# Patient Record
Sex: Female | Born: 1980
Health system: Southern US, Community
[De-identification: ages and names within clinical notes are randomized; demographics above are authoritative.]

## PROBLEM LIST (undated history)

## (undated) DIAGNOSIS — G709 Myoneural disorder, unspecified: Secondary | ICD-10-CM

## (undated) DIAGNOSIS — O039 Complete or unspecified spontaneous abortion without complication: Secondary | ICD-10-CM

## (undated) DIAGNOSIS — Z8659 Personal history of other mental and behavioral disorders: Secondary | ICD-10-CM

## (undated) DIAGNOSIS — F329 Major depressive disorder, single episode, unspecified: Secondary | ICD-10-CM

## (undated) DIAGNOSIS — Z91419 Personal history of unspecified adult abuse: Secondary | ICD-10-CM

## (undated) DIAGNOSIS — E041 Nontoxic single thyroid nodule: Secondary | ICD-10-CM

## (undated) DIAGNOSIS — Z87440 Personal history of urinary (tract) infections: Secondary | ICD-10-CM

## (undated) DIAGNOSIS — O1410 Severe pre-eclampsia, unspecified trimester: Secondary | ICD-10-CM

## (undated) DIAGNOSIS — I8290 Acute embolism and thrombosis of unspecified vein: Secondary | ICD-10-CM

## (undated) DIAGNOSIS — F988 Other specified behavioral and emotional disorders with onset usually occurring in childhood and adolescence: Secondary | ICD-10-CM

## (undated) DIAGNOSIS — R87619 Unspecified abnormal cytological findings in specimens from cervix uteri: Secondary | ICD-10-CM

## (undated) DIAGNOSIS — F32A Depression, unspecified: Secondary | ICD-10-CM

## (undated) HISTORY — DX: Depression, unspecified: F32.A

## (undated) HISTORY — DX: Personal history of unspecified adult abuse: Z91.419

## (undated) HISTORY — DX: Nontoxic single thyroid nodule: E04.1

## (undated) HISTORY — PX: TONSILLECTOMY: SUR1361

## (undated) HISTORY — DX: Personal history of urinary (tract) infections: Z87.440

## (undated) HISTORY — DX: Personal history of other mental and behavioral disorders: Z86.59

## (undated) HISTORY — DX: Myoneural disorder, unspecified: G70.9

## (undated) HISTORY — DX: Other specified behavioral and emotional disorders with onset usually occurring in childhood and adolescence: F98.8

## (undated) HISTORY — DX: Severe pre-eclampsia, unspecified trimester: O14.10

## (undated) HISTORY — PX: TOE SURGERY: SHX1073

## (undated) HISTORY — DX: Acute embolism and thrombosis of unspecified vein: I82.90

---
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
## 2012-06-10 ENCOUNTER — Encounter (HOSPITAL_COMMUNITY): Payer: Self-pay | Admitting: Emergency Medicine

## 2012-06-10 ENCOUNTER — Emergency Department (HOSPITAL_COMMUNITY)
Admission: EM | Admit: 2012-06-10 | Discharge: 2012-06-11 | Disposition: A | Discharge status: Home / Self Care | Payer: Medicaid Other | Attending: Emergency Medicine | Admitting: Emergency Medicine

## 2012-06-10 DIAGNOSIS — M549 Dorsalgia, unspecified: Secondary | ICD-10-CM | POA: Insufficient documentation

## 2012-06-10 DIAGNOSIS — IMO0001 Reserved for inherently not codable concepts without codable children: Secondary | ICD-10-CM

## 2012-06-10 DIAGNOSIS — R5381 Other malaise: Secondary | ICD-10-CM | POA: Insufficient documentation

## 2012-06-10 DIAGNOSIS — R509 Fever, unspecified: Secondary | ICD-10-CM

## 2012-06-10 DIAGNOSIS — O039 Complete or unspecified spontaneous abortion without complication: Secondary | ICD-10-CM

## 2012-06-10 DIAGNOSIS — R110 Nausea: Secondary | ICD-10-CM | POA: Insufficient documentation

## 2012-06-10 DIAGNOSIS — N83209 Unspecified ovarian cyst, unspecified side: Secondary | ICD-10-CM | POA: Insufficient documentation

## 2012-06-10 DIAGNOSIS — R109 Unspecified abdominal pain: Secondary | ICD-10-CM

## 2012-06-10 HISTORY — DX: Reserved for inherently not codable concepts without codable children: IMO0001

## 2012-06-10 HISTORY — DX: Complete or unspecified spontaneous abortion without complication: O03.9

## 2012-06-10 LAB — URINALYSIS, ROUTINE W REFLEX MICROSCOPIC
Bilirubin Urine: NEGATIVE
Glucose, UA: NEGATIVE mg/dL
Hgb urine dipstick: NEGATIVE
Ketones, ur: NEGATIVE mg/dL
Leukocytes, UA: NEGATIVE
Nitrite: NEGATIVE
Protein, ur: NEGATIVE mg/dL
Specific Gravity, Urine: 1.007 (ref 1.005–1.030)
Urobilinogen, UA: 0.2 mg/dL (ref 0.0–1.0)
pH: 6 (ref 5.0–8.0)

## 2012-06-10 MED ORDER — SODIUM CHLORIDE 0.9 % IV BOLUS (SEPSIS)
1000.0000 mL | Freq: Once | INTRAVENOUS | Status: AC
  Administered 2012-06-11: 1000 mL via INTRAVENOUS

## 2012-06-10 NOTE — ED Notes (Signed)
Pt c/o fever, chills, back pain and abd pain. Fever 101 at home. Pt did have elective abortion Friday.

## 2012-06-11 ENCOUNTER — Encounter (HOSPITAL_COMMUNITY): Payer: Self-pay

## 2012-06-11 ENCOUNTER — Emergency Department (HOSPITAL_COMMUNITY): Payer: Medicaid Other

## 2012-06-11 LAB — CBC WITH DIFFERENTIAL/PLATELET
Basophils Absolute: 0 10*3/uL (ref 0.0–0.1)
Basophils Relative: 0 % (ref 0–1)
Eosinophils Absolute: 0.1 10*3/uL (ref 0.0–0.7)
Eosinophils Relative: 2 % (ref 0–5)
HCT: 34.2 % — ABNORMAL LOW (ref 36.0–46.0)
Hemoglobin: 12.2 g/dL (ref 12.0–15.0)
Lymphocytes Relative: 15 % (ref 12–46)
Lymphs Abs: 1.1 10*3/uL (ref 0.7–4.0)
MCH: 30.1 pg (ref 26.0–34.0)
MCHC: 35.7 g/dL (ref 30.0–36.0)
MCV: 84.4 fL (ref 78.0–100.0)
Monocytes Absolute: 0.9 10*3/uL (ref 0.1–1.0)
Monocytes Relative: 12 % (ref 3–12)
Neutro Abs: 5.3 10*3/uL (ref 1.7–7.7)
Neutrophils Relative %: 71 % (ref 43–77)
Platelets: 177 10*3/uL (ref 150–400)
RBC: 4.05 MIL/uL (ref 3.87–5.11)
RDW: 12 % (ref 11.5–15.5)
WBC: 7.4 10*3/uL (ref 4.0–10.5)

## 2012-06-11 LAB — BASIC METABOLIC PANEL
BUN: 11 mg/dL (ref 6–23)
CO2: 24 mEq/L (ref 19–32)
Calcium: 9 mg/dL (ref 8.4–10.5)
Chloride: 100 mEq/L (ref 96–112)
Creatinine, Ser: 0.56 mg/dL (ref 0.50–1.10)
GFR calc Af Amer: >90 mL/min (ref >90)
GFR calc non Af Amer: >90 mL/min (ref >90)
Glucose, Bld: 90 mg/dL (ref 70–99)
Potassium: 3.4 mEq/L — ABNORMAL LOW (ref 3.5–5.1)
Sodium: 135 mEq/L (ref 135–145)

## 2012-06-11 LAB — WET PREP, GENITAL
Trich, Wet Prep: NONE SEEN
Yeast Wet Prep HPF POC: NONE SEEN

## 2012-06-11 MED ORDER — ONDANSETRON HCL 4 MG/2ML IJ SOLN
4.0000 mg | Freq: Once | INTRAMUSCULAR | Status: AC
  Administered 2012-06-11: 4 mg via INTRAVENOUS
  Filled 2012-06-11: qty 2

## 2012-06-11 MED ORDER — POTASSIUM CHLORIDE CRYS ER 20 MEQ PO TBCR
40.0000 meq | EXTENDED_RELEASE_TABLET | Freq: Once | ORAL | Status: AC
  Administered 2012-06-11: 40 meq via ORAL
  Filled 2012-06-11: qty 2

## 2012-06-11 MED ORDER — IOHEXOL 300 MG/ML  SOLN
50.0000 mL | Freq: Once | INTRAMUSCULAR | Status: AC | PRN
  Administered 2012-06-11: 50 mL via ORAL

## 2012-06-11 MED ORDER — IBUPROFEN 200 MG PO TABS
400.0000 mg | ORAL_TABLET | Freq: Once | ORAL | Status: AC
Start: 2012-06-11 — End: 2012-06-11
  Administered 2012-06-11: 400 mg via ORAL
  Filled 2012-06-11: qty 2

## 2012-06-11 MED ORDER — MORPHINE SULFATE 2 MG/ML IJ SOLN
2.0000 mg | Freq: Once | INTRAMUSCULAR | Status: AC
  Administered 2012-06-11: 2 mg via INTRAVENOUS
  Filled 2012-06-11: qty 1

## 2012-06-11 MED ORDER — IOHEXOL 300 MG/ML  SOLN
100.0000 mL | Freq: Once | INTRAMUSCULAR | Status: AC | PRN
  Administered 2012-06-11: 100 mL via INTRAVENOUS

## 2012-06-11 NOTE — ED Provider Notes (Signed)
History     CSN: [id number]  Arrival date & time 06/10/12  2249   First MD Initiated Contact with Patient 06/10/12 2313      Chief Complaint  Patient presents with  . Post-op Problem   HPI  History provided by the patient and mother. Patient is a 32 year old female with no significant PMH and history of recent elective abortion with D&C procedure performed yesterday who presents with complaints of fever and lower abdomen and back pain. Patient reports having only slight discomfort following the procedure with expected vaginal bleeding. Vaginal bleeding did improve however today she began feeling increased fatigue and fever symptoms with chills. Fever began to increase above 101 at home and patient called the OB/GYN clinic in [facility] where she had procedure performed and was advised to come to emergency room. She took 400 mg of ibuprofen at 9:30 prior to arrival and reports this helped with fever and chills symptoms. She continues to complain of mild discomfort primarily in the back. Denies any nausea, vomiting symptoms. No dysuria, hematuria or urinary frequency. Denies any diarrhea or constipation.    Past Medical History  Diagnosis Date  . Abortion 06/10/2012    History reviewed. No pertinent past surgical history.  No family history on file.  History  Substance Use Topics  . Smoking status: Never Smoker   . Smokeless tobacco: Not on file  . Alcohol Use: No    OB History   Grav Para Term Preterm Abortions TAB SAB Ect Mult Living                  Review of Systems  Constitutional: Positive for fever and chills.  Respiratory: Negative for cough and shortness of breath.   Gastrointestinal: Positive for nausea and abdominal pain. Negative for vomiting, diarrhea and constipation.  Genitourinary: Positive for vaginal bleeding. Negative for dysuria, frequency, hematuria, flank pain, vaginal discharge and pelvic pain.  Musculoskeletal: Positive for back pain.  All other  systems reviewed and are negative.    Allergies  Review of patient's allergies indicates no known allergies.  Home Medications   Current Outpatient Rx  Name  Route  Sig  Dispense  Refill  . amoxicillin (AMOXIL) 500 MG tablet   Oral   Take 500 mg by mouth 2 (two) times daily.         Marland Kitchen ibuprofen (ADVIL,MOTRIN) 400 MG tablet   Oral   Take 400 mg by mouth every 6 (six) hours as needed for fever.           BP 124/68  Pulse 92  Temp(Src) 99.3 F (37.4 C) (Oral)  Resp 18  Ht 5\' 4"  (1.626 m)  Wt 143 lb 4.8 oz (65 kg)  BMI 24.59 kg/m2  SpO2 100%  LMP 05/01/2012  Physical Exam  Nursing note and vitals reviewed. Constitutional: She is oriented to person, place, and time. She appears well-developed and well-nourished. No distress.  HENT:  Head: Normocephalic.  Cardiovascular: Normal rate and regular rhythm.   Pulmonary/Chest: Effort normal and breath sounds normal. No respiratory distress. She has no wheezes.  Abdominal: Soft. There is tenderness in the right lower quadrant. There is tenderness at McBurney's point. There is no rebound, no guarding and no CVA tenderness.  Genitourinary:  Chaperone was present. Small amounts of blood streaks in discharge cervix. Cervix otherwise appears normal. There is no significant adnexal or uterine tenderness.   Musculoskeletal: Normal range of motion.  Neurological: She is alert and oriented to person, place,  and time.  Skin: Skin is warm and dry. No rash noted.  Psychiatric: She has a normal mood and affect. Her behavior is normal.    ED Course  Procedures   Results for orders placed during the hospital encounter of 06/10/12  WET PREP, GENITAL      Result Value Range   Yeast Wet Prep HPF POC NONE SEEN  NONE SEEN   Trich, Wet Prep NONE SEEN  NONE SEEN   Clue Cells Wet Prep HPF POC RARE (*) NONE SEEN   WBC, Wet Prep HPF POC FEW (*) NONE SEEN  CBC WITH DIFFERENTIAL      Result Value Range   WBC 7.4  4.0 - 10.5 K/uL   RBC 4.05   3.87 - 5.11 MIL/uL   Hemoglobin 12.2  12.0 - 15.0 g/dL   HCT 40.9 (*) 81.1 - 91.4 %   MCV 84.4  78.0 - 100.0 fL   MCH 30.1  26.0 - 34.0 pg   MCHC 35.7  30.0 - 36.0 g/dL   RDW 78.2  95.6 - 21.3 %   Platelets 177  150 - 400 K/uL   Neutrophils Relative 71  43 - 77 %   Neutro Abs 5.3  1.7 - 7.7 K/uL   Lymphocytes Relative 15  12 - 46 %   Lymphs Abs 1.1  0.7 - 4.0 K/uL   Monocytes Relative 12  3 - 12 %   Monocytes Absolute 0.9  0.1 - 1.0 K/uL   Eosinophils Relative 2  0 - 5 %   Eosinophils Absolute 0.1  0.0 - 0.7 K/uL   Basophils Relative 0  0 - 1 %   Basophils Absolute 0.0  0.0 - 0.1 K/uL  URINALYSIS, ROUTINE W REFLEX MICROSCOPIC      Result Value Range   Color, Urine YELLOW  YELLOW   APPearance CLOUDY (*) CLEAR   Specific Gravity, Urine 1.007  1.005 - 1.030   pH 6.0  5.0 - 8.0   Glucose, UA NEGATIVE  NEGATIVE mg/dL   Hgb urine dipstick NEGATIVE  NEGATIVE   Bilirubin Urine NEGATIVE  NEGATIVE   Ketones, ur NEGATIVE  NEGATIVE mg/dL   Protein, ur NEGATIVE  NEGATIVE mg/dL   Urobilinogen, UA 0.2  0.0 - 1.0 mg/dL   Nitrite NEGATIVE  NEGATIVE   Leukocytes, UA NEGATIVE  NEGATIVE  BASIC METABOLIC PANEL      Result Value Range   Sodium 135  135 - 145 mEq/L   Potassium 3.4 (*) 3.5 - 5.1 mEq/L   Chloride 100  96 - 112 mEq/L   CO2 24  19 - 32 mEq/L   Glucose, Bld 90  70 - 99 mg/dL   BUN 11  6 - 23 mg/dL   Creatinine, Ser 0.86  0.50 - 1.10 mg/dL   Calcium 9.0  8.4 - 57.8 mg/dL   GFR calc non Af Amer >90  >90 mL/min   GFR calc Af Amer >90  >90 mL/min        US Transvaginal Non-ob  06/11/2012  *RADIOLOGY REPORT*  Clinical Data: Pelvic pain post abortion 06/08/2012.  Fever, back pain, cramping.  TRANSABDOMINAL AND TRANSVAGINAL ULTRASOUND OF PELVIS Technique:  Both transabdominal and transvaginal ultrasound examinations of the pelvis were performed. Transabdominal technique was performed for global imaging of the pelvis including uterus, ovaries, adnexal regions, and pelvic cul-de-sac.   It was necessary to proceed with endovaginal exam following the transabdominal exam to visualize the pelvic organs as the pelvis was obscured by  bowel gas on transabdominal imaging.  Comparison:  None  Findings:  Uterus: The uterus is retroverted and measures about 8.9 x 5.6 x 6.6 cm.  No myometrial masses.  Small cysts in the cervical region consistent with Nabothian cysts.  Endometrium: Thickened endometrium with heterogeneous mixed echotexture material.  Endometrial stripe thickness measures up to about 17 mm.  Color flow Doppler imaging demonstrates a myometrial hyperemia with minimal flow in the endometrial collections. Changes may represent blood clots or retained products of conception.  Right ovary:  Right ovary measures 3.7 x 2 x 1.9 cm.  Normal follicular changes are demonstrated.  There is a dominant complex appearing cystic structure measuring about 1.9 cm diameter probably representing a corpus luteum.  Flow is demonstrated in the right ovary on color flow Doppler imaging.  Left ovary: The left ovary measures 2.7 x 1 x 1.6 cm.  Normal follicular changes are demonstrated.  Flow is demonstrated in the left ovary on color flow Doppler imaging.  Other findings: Small amount of free fluid demonstrated throughout the pelvis.  Internal echoes are present suggesting complex fluid. Changes could represent infection or hemorrhage.  IMPRESSION: Expansion and endometrium with mixed echotexture material containing minimal blood flow.  Changes could represent blood clots or retained products of conception.  Resolving corpus luteum of the right ovary.  Small amount of complex fluid in the pelvis could represent hemorrhage or infection.   Original Report Authenticated By: Burman Nieves, M.D.    US Pelvis Complete  06/11/2012  *RADIOLOGY REPORT*  Clinical Data: Pelvic pain post abortion 06/08/2012.  Fever, back pain, cramping.  TRANSABDOMINAL AND TRANSVAGINAL ULTRASOUND OF PELVIS Technique:  Both transabdominal and  transvaginal ultrasound examinations of the pelvis were performed. Transabdominal technique was performed for global imaging of the pelvis including uterus, ovaries, adnexal regions, and pelvic cul-de-sac.  It was necessary to proceed with endovaginal exam following the transabdominal exam to visualize the pelvic organs as the pelvis was obscured by bowel gas on transabdominal imaging.  Comparison:  None  Findings:  Uterus: The uterus is retroverted and measures about 8.9 x 5.6 x 6.6 cm.  No myometrial masses.  Small cysts in the cervical region consistent with Nabothian cysts.  Endometrium: Thickened endometrium with heterogeneous mixed echotexture material.  Endometrial stripe thickness measures up to about 17 mm.  Color flow Doppler imaging demonstrates a myometrial hyperemia with minimal flow in the endometrial collections. Changes may represent blood clots or retained products of conception.  Right ovary:  Right ovary measures 3.7 x 2 x 1.9 cm.  Normal follicular changes are demonstrated.  There is a dominant complex appearing cystic structure measuring about 1.9 cm diameter probably representing a corpus luteum.  Flow is demonstrated in the right ovary on color flow Doppler imaging.  Left ovary: The left ovary measures 2.7 x 1 x 1.6 cm.  Normal follicular changes are demonstrated.  Flow is demonstrated in the left ovary on color flow Doppler imaging.  Other findings: Small amount of free fluid demonstrated throughout the pelvis.  Internal echoes are present suggesting complex fluid. Changes could represent infection or hemorrhage.  IMPRESSION: Expansion and endometrium with mixed echotexture material containing minimal blood flow.  Changes could represent blood clots or retained products of conception.  Resolving corpus luteum of the right ovary.  Small amount of complex fluid in the pelvis could represent hemorrhage or infection.   Original Report Authenticated By: Burman Nieves, M.D.    Ct Abdomen Pelvis  W Contrast  06/11/2012  *RADIOLOGY  REPORT*  Clinical Data: Fever, chills, back pain, and abdominal pain. History of abortion on 06/09/2012.  CT ABDOMEN AND PELVIS WITH CONTRAST  Technique:  Multidetector CT imaging of the abdomen and pelvis was performed following the standard protocol during bolus administration of intravenous contrast.  Contrast: OMNIPAQUE IOHEXOL 300 MG/ML  SOLN  Comparison: None.  Findings: The lung bases are clear.  The liver, spleen, gallbladder, pancreas, adrenal glands, kidneys, abdominal aorta, and retroperitoneal lymph nodes are unremarkable. Small accessory spleen.  The stomach, small bowel, and colon are decompressed.  Contrast material flows to the colon suggesting no evidence of obstruction.  No free air or free fluid in the abdomen.  Pelvis:  Mild prominence of the uterus and endometrium.  Uterus is retroverted.  See additional report of pelvic ultrasound from today's date.  Minimal free fluid demonstrated in the pelvis.  No loculated fluid collection to suggest abscess.  Mild thickening of the bladder wall may represent hypertrophy or cystitis.  No significant pelvic lymphadenopathy.  The appendix is normal.  No diverticulitis.  Normal alignment of the lumbar vertebrae.  IMPRESSION: Mild prominence of the uterus with endometrial thickening and small amount of free pelvic fluid.  See additional report of pelvic ultrasound of the same date.  Examination is otherwise unremarkable.   Original Report Authenticated By: Burman Nieves, M.D.      1. Fever   2. Abdominal pain   3. Ovarian cyst       MDM  Patient seen and evaluated. Patient in no acute distress with no signs of significant discomfort at this time. Patient does not appear severely ill or toxic.  Pt had elective D&C for abortion at [facility] in [facility] on Friday by Dr. Okey Dupre. [phone]  Pt continues to do well.  Labs unremarkable so far. Imaging still pending.    [place] and CT without any  overwhelmingly concerning findings.  There may be evidence of retained products of conception vs blood clots.    Spoke with Dr. Wallie Renshaw on call for Dr. Okey Dupre at [facility]. I discussed lab tests results and imaging findings. He feels patient may followup outpatient in the office later today.   Discussed plan with patient and instructed her to call office today for close followup she agrees with plan.   Angus Seller, PA-C 06/11/12 [phone]

## 2012-06-11 NOTE — ED Provider Notes (Signed)
Medical screening examination/treatment/procedure(s) were performed by non-physician practitioner and as supervising physician I was immediately available for consultation/collaboration.  Ethelda Chick, MD 06/11/12 [phone]

## 2012-06-12 LAB — GC/CHLAMYDIA PROBE AMP
CT Probe RNA: NEGATIVE
GC Probe RNA: NEGATIVE

## 2012-09-20 ENCOUNTER — Inpatient Hospital Stay (HOSPITAL_COMMUNITY)
Admission: AD | Admit: 2012-09-20 | Discharge: 2012-09-20 | Discharge status: Against Medical Advice | Payer: Medicaid Other | Source: Ambulatory Visit | Attending: Obstetrics & Gynecology | Admitting: Obstetrics & Gynecology

## 2012-09-20 NOTE — MAU Note (Signed)
Not in lobby

## 2013-02-04 ENCOUNTER — Emergency Department (HOSPITAL_COMMUNITY): Payer: No Typology Code available for payment source

## 2013-02-04 ENCOUNTER — Encounter (HOSPITAL_COMMUNITY): Payer: Self-pay | Admitting: Emergency Medicine

## 2013-02-04 ENCOUNTER — Emergency Department (HOSPITAL_COMMUNITY)
Admission: EM | Admit: 2013-02-04 | Discharge: 2013-02-04 | Disposition: A | Discharge status: Home / Self Care | Payer: No Typology Code available for payment source | Attending: Emergency Medicine | Admitting: Emergency Medicine

## 2013-02-04 DIAGNOSIS — M542 Cervicalgia: Secondary | ICD-10-CM | POA: Diagnosis not present

## 2013-02-04 DIAGNOSIS — S59909A Unspecified injury of unspecified elbow, initial encounter: Secondary | ICD-10-CM | POA: Diagnosis present

## 2013-02-04 DIAGNOSIS — S63509A Unspecified sprain of unspecified wrist, initial encounter: Secondary | ICD-10-CM | POA: Diagnosis not present

## 2013-02-04 DIAGNOSIS — Y939 Activity, unspecified: Secondary | ICD-10-CM | POA: Diagnosis not present

## 2013-02-04 DIAGNOSIS — Y92410 Unspecified street and highway as the place of occurrence of the external cause: Secondary | ICD-10-CM | POA: Insufficient documentation

## 2013-02-04 MED ORDER — ONDANSETRON 4 MG PO TBDP
4.0000 mg | ORAL_TABLET | Freq: Once | ORAL | Status: AC
  Administered 2013-02-04: 4 mg via ORAL
  Filled 2013-02-04: qty 1

## 2013-02-04 MED ORDER — IBUPROFEN 400 MG PO TABS
800.0000 mg | ORAL_TABLET | Freq: Once | ORAL | Status: AC
  Administered 2013-02-04: 800 mg via ORAL
  Filled 2013-02-04: qty 2
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
## 2013-02-07 NOTE — ED Provider Notes (Signed)
Medical screening examination/treatment/procedure(s) were performed by non-physician practitioner and as supervising physician I was immediately available for consultation/collaboration.  EKG Interpretation   None        Juliet Rude. Rubin Payor, MD 02/07/13 [phone]

## 2013-06-14 ENCOUNTER — Ambulatory Visit: Payer: Medicaid Other | Admitting: Endocrinology

## 2013-06-20 ENCOUNTER — Encounter: Payer: Self-pay | Admitting: Endocrinology

## 2013-06-20 ENCOUNTER — Ambulatory Visit (INDEPENDENT_AMBULATORY_CARE_PROVIDER_SITE_OTHER): Payer: BC Managed Care – PPO | Admitting: Endocrinology

## 2013-06-20 VITALS — BP 122/64 | HR 78 | Temp 97.7°F | Resp 16 | Ht 64.0 in | Wt 143.2 lb

## 2013-06-20 DIAGNOSIS — E041 Nontoxic single thyroid nodule: Secondary | ICD-10-CM
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
## 2013-06-21 LAB — TSH: TSH: 0.31 u[IU]/mL — ABNORMAL LOW (ref 0.35–5.50)

## 2013-06-21 LAB — T4, FREE: Free T4: 0.82 ng/dL (ref 0.60–1.60)

## 2013-06-21 NOTE — Addendum Note (Signed)
Addended by: Elayne Snare on: 06/21/2013 12:52 PM   Modules accepted: Orders

## 2013-06-24 ENCOUNTER — Telehealth: Payer: Self-pay | Admitting: Endocrinology

## 2013-06-24 ENCOUNTER — Ambulatory Visit
Admission: RE | Admit: 2013-06-24 | Discharge: 2013-06-24 | Disposition: A | Discharge status: Home / Self Care | Payer: BC Managed Care – PPO | Source: Ambulatory Visit | Attending: Endocrinology | Admitting: Endocrinology

## 2013-06-24 DIAGNOSIS — E041 Nontoxic single thyroid nodule: Secondary | ICD-10-CM

## 2013-06-24 NOTE — Telephone Encounter (Signed)
Pt would like some more details on the testing you are sending her for.

## 2013-07-01 ENCOUNTER — Ambulatory Visit (HOSPITAL_COMMUNITY): Payer: BC Managed Care – PPO

## 2013-07-02 ENCOUNTER — Encounter: Payer: Self-pay | Admitting: Sports Medicine

## 2013-07-02 ENCOUNTER — Other Ambulatory Visit: Payer: Self-pay | Admitting: *Deleted

## 2013-07-02 ENCOUNTER — Ambulatory Visit (INDEPENDENT_AMBULATORY_CARE_PROVIDER_SITE_OTHER): Payer: BC Managed Care – PPO | Admitting: Sports Medicine

## 2013-07-02 VITALS — BP 148/97 | HR 89 | Ht 64.0 in | Wt 143.0 lb

## 2013-07-02 DIAGNOSIS — G894 Chronic pain syndrome: Secondary | ICD-10-CM | POA: Insufficient documentation

## 2013-07-02 DIAGNOSIS — IMO0002 Reserved for concepts with insufficient information to code with codable children: Secondary | ICD-10-CM

## 2013-07-02 DIAGNOSIS — S139XXA Sprain of joints and ligaments of unspecified parts of neck, initial encounter: Secondary | ICD-10-CM

## 2013-07-02 MED ORDER — AMITRIPTYLINE HCL 25 MG PO TABS: 25.0000 mg | ORAL_TABLET | Freq: Every day | ORAL | Status: DC

## 2013-07-02 MED ORDER — TRAMADOL HCL 50 MG PO TABS: 50.0000 mg | ORAL_TABLET | Freq: Four times a day (QID) | ORAL | Status: DC | PRN
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
## 2013-07-02 NOTE — Patient Instructions (Addendum)
Stop taking cyclobenzaprine- start taking amitriptyline 25 mg and tramadol 50 mg at bedtime.  Take diclofenac during the day as needed  Do easy range of motion with neck and shake outs  Try thera wrap   Vitamin B6 - 100 mg daily   Ok to try Dr. Angela Adam for PT   Please follow up in 4 weeks  Thank you for seeing [place] today!

## 2013-07-02 NOTE — Assessment & Plan Note (Signed)
We will start her treatment with more consistent medication usage.  College amitriptyline at a higher dose than the Flexeril. Regular tramadol at nighttime. Diclofenac twice daily during working hours.  Use heat over trapezius muscle bilaterally.  She will see Erica Atkins for PT and see if he can progress her to a greater function.  She may want to try a soft cervical collar for some relative rest of her neck.  Recheck by me in one month after trying these changes.

## 2013-07-05 ENCOUNTER — Telehealth: Payer: Self-pay | Admitting: *Deleted

## 2013-07-05 NOTE — Telephone Encounter (Signed)
Patient was calling you back about her results. CB# [id number]

## 2013-07-05 NOTE — Telephone Encounter (Signed)
Discuss previous results including thyroid functions. She is going to do a nuclear scan and we will discuss possible biopsy subsequently

## 2013-07-08 ENCOUNTER — Encounter: Payer: Self-pay | Admitting: *Deleted

## 2013-07-08 NOTE — Progress Notes (Signed)
Patient ID: Erica Atkins, female   DOB: 05-Jan-1981, 33 y.o.   MRN: [id number] Per dr fields, told pt on Friday: 1-called/will increase tramadol 2-watch for signs of infection from dry needling 3-go to [facility] if any fever or infection

## 2013-07-11 ENCOUNTER — Encounter (HOSPITAL_COMMUNITY)
Admission: RE | Admit: 2013-07-11 | Discharge: 2013-07-11 | Disposition: A | Discharge status: Home / Self Care | Payer: BC Managed Care – PPO | Source: Ambulatory Visit | Attending: Endocrinology | Admitting: Endocrinology

## 2013-07-11 DIAGNOSIS — E079 Disorder of thyroid, unspecified: Secondary | ICD-10-CM | POA: Insufficient documentation

## 2013-07-11 DIAGNOSIS — E041 Nontoxic single thyroid nodule: Secondary | ICD-10-CM

## 2013-07-11 MED ORDER — SODIUM PERTECHNETATE TC 99M INJECTION
10.0000 | Freq: Once | INTRAVENOUS | Status: AC | PRN
  Administered 2013-07-11: 10 via INTRAVENOUS

## 2013-07-30 ENCOUNTER — Ambulatory Visit: Payer: BC Managed Care – PPO | Admitting: Sports Medicine

## 2013-09-12 ENCOUNTER — Encounter: Payer: Self-pay | Admitting: Sports Medicine

## 2013-09-12 ENCOUNTER — Ambulatory Visit (INDEPENDENT_AMBULATORY_CARE_PROVIDER_SITE_OTHER): Payer: BC Managed Care – PPO | Admitting: Sports Medicine

## 2013-09-12 VITALS — Ht 64.0 in | Wt 143.0 lb

## 2013-09-12 DIAGNOSIS — IMO0002 Reserved for concepts with insufficient information to code with codable children: Secondary | ICD-10-CM

## 2013-09-12 DIAGNOSIS — S139XXA Sprain of joints and ligaments of unspecified parts of neck, initial encounter: Secondary | ICD-10-CM

## 2013-09-12 DIAGNOSIS — G9050 Complex regional pain syndrome I, unspecified: Secondary | ICD-10-CM

## 2013-09-12 MED ORDER — DICLOFENAC SODIUM 75 MG PO TBEC: 75.0000 mg | DELAYED_RELEASE_TABLET | Freq: Two times a day (BID) | ORAL | Status: DC

## 2013-09-12 MED ORDER — AMITRIPTYLINE HCL 10 MG PO TABS: 10.0000 mg | ORAL_TABLET | Freq: Every day | ORAL | Status: DC
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
## 2013-09-12 NOTE — Patient Instructions (Addendum)
Balance exercises:   - Reach to touch object on floor. Repeat with eye closed  - Turn in circles with eyes closed, then jump up and down   Physical therapy:  Twice weekly for two weeks Weekly for four weeks Then transition to home program   Decrease amitriptyline to 10 mg nightly

## 2013-09-12 NOTE — Assessment & Plan Note (Signed)
I think she shows some improvement overall in her neck symptoms  I don't think her present symptoms are likely to any spinal stenosis  I suspect there is more the regional pain syndrome and hopefully this will respond to long term use of low-dose amitriptyline in physical therapy

## 2013-10-29 ENCOUNTER — Encounter: Payer: Self-pay | Admitting: Sports Medicine

## 2013-10-29 ENCOUNTER — Ambulatory Visit (INDEPENDENT_AMBULATORY_CARE_PROVIDER_SITE_OTHER): Payer: BC Managed Care – PPO | Admitting: Sports Medicine

## 2013-10-29 VITALS — BP 129/87 | HR 73 | Ht 64.0 in | Wt 143.3 lb

## 2013-10-29 DIAGNOSIS — S139XXA Sprain of joints and ligaments of unspecified parts of neck, initial encounter: Secondary | ICD-10-CM | POA: Diagnosis not present

## 2013-10-29 DIAGNOSIS — IMO0002 Reserved for concepts with insufficient information to code with codable children: Secondary | ICD-10-CM
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
## 2013-10-29 NOTE — Assessment & Plan Note (Signed)
She appears to found a treatment with some manipulation and chiropractic work that is helping I advised her to continue this  Continue on a home exercise program that emphasizes balance, posture and relaxation  I think it first the stress of this long-term injury may hard for her to relax and sleep normally I did advise certain relaxation techniques so she does not get neck spasm as often  She will continue with this plan and then see me in 2-3 months

## 2013-10-29 NOTE — Patient Instructions (Signed)
Glad you're doing better!!!  Continue seeing chiropractor, if this is helping  Exercises to strengthen right shoulder muscles, improve scapular (shoulder blade) support.  Rowing, external rotation, wall push-ups, etc.  Biofeedback, relaxation techniques, as discussed, particularly when you notice that you are getting tense.    Consider Vitamin B6 50 mg daily, which may help with nerve irritation/injury.

## 2013-12-03 ENCOUNTER — Ambulatory Visit (INDEPENDENT_AMBULATORY_CARE_PROVIDER_SITE_OTHER): Payer: BC Managed Care – PPO | Admitting: Sports Medicine

## 2013-12-03 ENCOUNTER — Encounter: Payer: Self-pay | Admitting: Sports Medicine

## 2013-12-03 VITALS — BP 143/83 | HR 78

## 2013-12-03 DIAGNOSIS — S139XXA Sprain of joints and ligaments of unspecified parts of neck, initial encounter: Secondary | ICD-10-CM | POA: Diagnosis not present

## 2013-12-03 DIAGNOSIS — IMO0002 Reserved for concepts with insufficient information to code with codable children: Secondary | ICD-10-CM
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
## 2013-12-03 NOTE — Assessment & Plan Note (Addendum)
-  With persistent intermittent trapezius and neck muscle spasm. -Formal order for physical therapy was placed today, as the patient has been out of PT for 2-3 months. -She will continue going to chiropractic treatments as well. -I think stress is also planning overall, with her also being in school and working -Given that she has been dealing with this chronic pain, and her condition certainly can be exacerbated by stress. It may not be unreasonable if her condition persists to discuss management of the psychosocial stress and coping mechanisms through a referral to behavioral health to teach her strategies for coping with stress and chronic pain. -She was provided a note for school detailing her condition with recommendations for allowing additional time with duties is she is experiencing an exacerbation of symptoms which prohibits her from her work. -She'll followup in 4 weeks or sooner for reassessment with Dr. Oneida Alar.

## 2013-12-10 ENCOUNTER — Encounter: Payer: Self-pay | Admitting: *Deleted

## 2013-12-10 NOTE — Patient Instructions (Signed)
Per Dr. Linnell Fulling, the pt can see chiropractor up to 3 times a week and receive PT 2-3 times a week.

## 2013-12-24 ENCOUNTER — Other Ambulatory Visit: Payer: Self-pay | Admitting: *Deleted

## 2013-12-24 ENCOUNTER — Telehealth: Payer: Self-pay | Admitting: *Deleted

## 2013-12-24 DIAGNOSIS — M25511 Pain in right shoulder: Secondary | ICD-10-CM

## 2013-12-24 NOTE — Telephone Encounter (Signed)
Sent PT referral to [facility] in [facility]

## 2014-01-07 ENCOUNTER — Ambulatory Visit (INDEPENDENT_AMBULATORY_CARE_PROVIDER_SITE_OTHER): Payer: BC Managed Care – PPO | Admitting: Sports Medicine

## 2014-01-07 ENCOUNTER — Encounter: Payer: Self-pay | Admitting: Sports Medicine

## 2014-01-07 VITALS — BP 134/87 | Ht 64.57 in | Wt 140.0 lb

## 2014-01-07 DIAGNOSIS — M62838 Other muscle spasm: Secondary | ICD-10-CM | POA: Insufficient documentation

## 2014-01-07 DIAGNOSIS — M6248 Contracture of muscle, other site: Secondary | ICD-10-CM | POA: Diagnosis not present

## 2014-01-07 MED ORDER — NORTRIPTYLINE HCL 10 MG PO CAPS: 10.0000 mg | ORAL_CAPSULE | Freq: Every day | ORAL | Status: DC
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
## 2014-01-08 ENCOUNTER — Other Ambulatory Visit (INDEPENDENT_AMBULATORY_CARE_PROVIDER_SITE_OTHER): Payer: BC Managed Care – PPO

## 2014-01-08 ENCOUNTER — Other Ambulatory Visit: Payer: Self-pay | Admitting: *Deleted

## 2014-01-08 DIAGNOSIS — E041 Nontoxic single thyroid nodule: Secondary | ICD-10-CM

## 2014-01-08 LAB — T4, FREE: Free T4: 0.92 ng/dL (ref 0.60–1.60)

## 2014-01-08 LAB — TSH: TSH: 0.61 u[IU]/mL (ref 0.35–4.50)

## 2014-01-13 ENCOUNTER — Ambulatory Visit: Payer: BC Managed Care – PPO

## 2014-01-14 ENCOUNTER — Other Ambulatory Visit: Payer: Self-pay | Admitting: *Deleted

## 2014-01-14 DIAGNOSIS — M25519 Pain in unspecified shoulder: Secondary | ICD-10-CM

## 2014-01-23 ENCOUNTER — Ambulatory Visit: Payer: BC Managed Care – PPO | Admitting: Endocrinology

## 2014-01-28 ENCOUNTER — Ambulatory Visit: Payer: BC Managed Care – PPO | Admitting: Sports Medicine

## 2014-02-04 ENCOUNTER — Other Ambulatory Visit: Payer: Self-pay | Admitting: *Deleted

## 2014-02-04 MED ORDER — GABAPENTIN 300 MG PO CAPS: 300.0000 mg | ORAL_CAPSULE | Freq: Every day | ORAL | Status: DC

## 2014-02-10 ENCOUNTER — Telehealth: Payer: Self-pay | Admitting: *Deleted

## 2014-02-10 NOTE — Telephone Encounter (Signed)
Called to make the appt to Dr Harvel Ricks but their office requires notes first. Will fax all her notes over to [phone], attn angie, and they will call pt with appt.

## 2014-02-11 ENCOUNTER — Ambulatory Visit (INDEPENDENT_AMBULATORY_CARE_PROVIDER_SITE_OTHER): Payer: BC Managed Care – PPO | Admitting: Sports Medicine

## 2014-02-11 ENCOUNTER — Encounter: Payer: Self-pay | Admitting: Sports Medicine

## 2014-02-11 VITALS — BP 138/91 | HR 92 | Ht 64.0 in | Wt 140.0 lb

## 2014-02-11 DIAGNOSIS — M62838 Other muscle spasm: Secondary | ICD-10-CM

## 2014-02-11 DIAGNOSIS — M5412 Radiculopathy, cervical region: Secondary | ICD-10-CM | POA: Diagnosis not present

## 2014-02-11 DIAGNOSIS — M6248 Contracture of muscle, other site: Secondary | ICD-10-CM | POA: Diagnosis not present

## 2014-02-11 NOTE — Patient Instructions (Signed)
Increase your gabapentin to 2  At night  If no excessive drowsiness after 1 week 1 in the morning and 2 anight  If no excessive drowsiness after week 2  1 morning / 1 at noon and 2 at night  We will prepare a letter for your neurosurgery visit and one detailing your visits here.
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
## 2014-02-11 NOTE — Assessment & Plan Note (Signed)
I am not sure if the ongoing symptoms are from the cervical spine changes noted on MRI or if they represent a Myofascial pain syndrome that has arisen form the chronic pain  I plan to push dose of gabapentin higher as long as she tolerates this  Get a consult from neuro surgery to see if we are missing more significant cervical spine pathology  Continue PT if helping

## 2014-02-11 NOTE — Assessment & Plan Note (Signed)
Spasm and winging today  Increase dose of gabapentin  Use tramadol as needed for pain relief

## 2014-02-27 ENCOUNTER — Encounter: Payer: Self-pay | Admitting: Endocrinology

## 2014-02-27 ENCOUNTER — Ambulatory Visit (INDEPENDENT_AMBULATORY_CARE_PROVIDER_SITE_OTHER): Payer: BC Managed Care – PPO | Admitting: Endocrinology

## 2014-02-27 ENCOUNTER — Other Ambulatory Visit: Payer: Self-pay | Admitting: *Deleted

## 2014-02-27 VITALS — BP 122/74 | HR 73 | Temp 97.7°F | Resp 14 | Ht 64.0 in | Wt 148.0 lb

## 2014-02-27 DIAGNOSIS — E041 Nontoxic single thyroid nodule: Secondary | ICD-10-CM

## 2014-02-27 DIAGNOSIS — Z23 Encounter for immunization: Secondary | ICD-10-CM
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
## 2014-03-24 ENCOUNTER — Other Ambulatory Visit: Payer: Self-pay | Admitting: *Deleted

## 2014-03-24 MED ORDER — TRAMADOL HCL 50 MG PO TABS: 50.0000 mg | ORAL_TABLET | Freq: Three times a day (TID) | ORAL | Status: DC | PRN

## 2014-06-10 ENCOUNTER — Encounter: Payer: Self-pay | Admitting: Sports Medicine

## 2014-06-10 ENCOUNTER — Ambulatory Visit (INDEPENDENT_AMBULATORY_CARE_PROVIDER_SITE_OTHER): Payer: 59 | Admitting: Sports Medicine

## 2014-06-10 VITALS — BP 151/99 | HR 97 | Ht 64.0 in | Wt 140.0 lb

## 2014-06-10 DIAGNOSIS — G894 Chronic pain syndrome: Secondary | ICD-10-CM

## 2014-06-10 MED ORDER — HYDROXYZINE PAMOATE 25 MG PO CAPS: 25.0000 mg | ORAL_CAPSULE | Freq: Three times a day (TID) | ORAL | Status: DC | PRN
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
## 2014-07-07 ENCOUNTER — Other Ambulatory Visit: Payer: Self-pay | Admitting: *Deleted

## 2014-07-07 MED ORDER — TRAMADOL HCL 50 MG PO TABS: 50.0000 mg | ORAL_TABLET | Freq: Three times a day (TID) | ORAL | Status: DC | PRN

## 2014-07-19 ENCOUNTER — Telehealth: Payer: Self-pay | Admitting: Family Medicine
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
## 2014-07-19 NOTE — Telephone Encounter (Signed)
Error.  Hilton Sinclair, MD

## 2014-07-21 ENCOUNTER — Other Ambulatory Visit: Payer: Self-pay | Admitting: *Deleted

## 2014-07-21 MED ORDER — TRAMADOL HCL 50 MG PO TABS: 50.0000 mg | ORAL_TABLET | Freq: Three times a day (TID) | ORAL | Status: DC | PRN

## 2014-07-23 ENCOUNTER — Telehealth: Payer: Self-pay | Admitting: Sports Medicine
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
## 2014-07-25 ENCOUNTER — Encounter (HOSPITAL_COMMUNITY): Payer: Self-pay | Admitting: Emergency Medicine

## 2014-07-25 ENCOUNTER — Emergency Department (HOSPITAL_COMMUNITY)
Admission: EM | Admit: 2014-07-25 | Discharge: 2014-07-25 | Disposition: A | Discharge status: Home / Self Care | Payer: 59 | Attending: Emergency Medicine | Admitting: Emergency Medicine

## 2014-07-25 DIAGNOSIS — G479 Sleep disorder, unspecified: Secondary | ICD-10-CM | POA: Diagnosis not present

## 2014-07-25 DIAGNOSIS — F419 Anxiety disorder, unspecified: Secondary | ICD-10-CM | POA: Diagnosis not present

## 2014-07-25 DIAGNOSIS — Z79899 Other long term (current) drug therapy: Secondary | ICD-10-CM | POA: Diagnosis not present

## 2014-07-25 DIAGNOSIS — G894 Chronic pain syndrome: Secondary | ICD-10-CM

## 2014-07-25 DIAGNOSIS — R6883 Chills (without fever): Secondary | ICD-10-CM | POA: Diagnosis present

## 2014-07-25 MED ORDER — HYDROXYZINE HCL 25 MG PO TABS: 25.0000 mg | ORAL_TABLET | Freq: Four times a day (QID) | ORAL | Status: DC

## 2014-07-25 MED ORDER — DICYCLOMINE HCL 20 MG PO TABS: 20.0000 mg | ORAL_TABLET | Freq: Two times a day (BID) | ORAL | Status: DC
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
## 2014-07-25 NOTE — Discharge Instructions (Signed)
Chronic Pain Chronic pain can be defined as pain that is off and on and lasts for 3-6 months or longer. Many things cause chronic pain, which can make it difficult to make a diagnosis. There are many treatment options available for chronic pain. However, finding a treatment that works well for you may require trying various approaches until the right one is found. Many people benefit from a combination of two or more types of treatment to control their pain. SYMPTOMS  Chronic pain can occur anywhere in the body and can range from mild to very severe. Some types of chronic pain include:  Headache.  Low back pain.  Cancer pain.  Arthritis pain.  Neurogenic pain. This is pain resulting from damage to nerves. People with chronic pain may also have other symptoms such as:  Depression.  Anger.  Insomnia.  Anxiety. DIAGNOSIS  Your health care provider will help diagnose your condition over time. In many cases, the initial focus will be on excluding possible conditions that could be causing the pain. Depending on your symptoms, your health care provider may order tests to diagnose your condition. Some of these tests may include:   Blood tests.   CT scan.   MRI.   X-rays.   Ultrasounds.   Nerve conduction studies.  You may need to see a specialist.  TREATMENT  Finding treatment that works well may take time. You may be referred to a pain specialist. He or she may prescribe medicine or therapies, such as:   Mindful meditation or yoga.  Shots (injections) of numbing or pain-relieving medicines into the spine or area of pain.  Local electrical stimulation.  Acupuncture.   Massage therapy.   Aroma, color, light, or sound therapy.   Biofeedback.   Working with a physical therapist to keep from getting stiff.   Regular, gentle exercise.   Cognitive or behavioral therapy.   Group support.  Sometimes, surgery may be recommended.  HOME CARE INSTRUCTIONS    Take all medicines as directed by your health care provider.   Lessen stress in your life by relaxing and doing things such as listening to calming music.   Exercise or be active as directed by your health care provider.   Eat a healthy diet and include things such as vegetables, fruits, fish, and lean meats in your diet.   Keep all follow-up appointments with your health care provider.   Attend a support group with others suffering from chronic pain. SEEK MEDICAL CARE IF:   Your pain gets worse.   You develop a new pain that was not there before.   You cannot tolerate medicines given to you by your health care provider.   You have new symptoms since your last visit with your health care provider.  SEEK IMMEDIATE MEDICAL CARE IF:   You feel weak.   You have decreased sensation or numbness.   You lose control of bowel or bladder function.   Your pain suddenly gets much worse.   You develop shaking.  You develop chills.  You develop confusion.  You develop chest pain.  You develop shortness of breath.  MAKE SURE YOU:  Understand these instructions.  Will watch your condition.  Will get help right away if you are not doing well or get worse. Document Released: 12/04/2001 Document Revised: 11/14/2012 Document Reviewed: 09/07/2012 [facility] Patient Information 2015 [place], [place]. This information is not intended to replace advice given to you by your health care provider. Make sure you discuss any  questions you have with your health care provider.   Emergency Department Resource Guide 1) Find a Doctor and Pay Out of Pocket Although you won't have to find out who is covered by your insurance plan, it is a good idea to ask around and get recommendations. You will then need to call the office and see if the doctor you have chosen will accept you as a new patient and what types of options they offer for patients who are self-pay. Some doctors offer  discounts or will set up payment plans for their patients who do not have insurance, but you will need to ask so you aren't surprised when you get to your appointment.  2) Contact Your Local Health Department Not all health departments have doctors that can see patients for sick visits, but many do, so it is worth a call to see if yours does. If you don't know where your local health department is, you can check in your phone book. The CDC also has a tool to help you locate your state's health department, and many state websites also have listings of all of their local health departments.  3) Find a [facility] If your illness is not likely to be very severe or complicated, you may want to try a walk in clinic. These are popping up all over the country in pharmacies, drugstores, and shopping centers. They're usually staffed by nurse practitioners or physician assistants that have been trained to treat common illnesses and complaints. They're usually fairly quick and inexpensive. However, if you have serious medical issues or chronic medical problems, these are probably not your best option.  No Primary Care Doctor: - Call Health Connect at  [phone] - they can help you locate a primary care doctor that  accepts your insurance, provides certain services, etc. - Physician Referral Service- [phone]  Chronic Pain Problems: Organization         Address  Phone   Notes  [facility]  [phone] Patients need to be referred by their primary care doctor.   Medication Assistance: Organization         Address  Phone   Notes  [facility] Medication [facility] [address] [place], [place] [address] [phone] --Must be a resident of [facility] -- Must have NO insurance coverage whatsoever (no Medicaid/ Medicare, etc.) -- The pt. MUST have a primary care doctor that directs their care regularly and follows them in the community   [facility]   [phone]   [employer]  [phone]    Agencies that provide inexpensive medical care: Organization         Address  Phone   Notes  [place]  [phone]   [facility]    [phone]   [facility] [place], [place] [address] [phone]   [place] [address], [place] [phone]   Planned Parenthood    [phone]   [facility]    [phone]   [place] and [place] [address] [address], [place] Phone:  [phone], Fax:  [phone] Hours of Operation:  9 am - 6 pm, M-F.  Also accepts Medicaid/Medicare and self-pay.  [facility] for [place] [address], [address], [place] Phone: [phone], Fax: [phone]. Hours of Operation:  8:30 am - 5:30 pm, M-F.  Also accepts Medicaid and self-pay.  [facility] [place] [address], [place] Phone: [phone]   [place], [place], [place] [phone], Ext. [phone] Mondays & Thursdays: 7-9 AM.  First 15 patients are seen on a first come, first serve basis.    [place] Providers:  Organization         Address  Phone   Notes  [facility] [address], [place] [phone] Also accepts self-pay patients.  [facility] [address], [place]  [phone]   [place], [address], [place] [phone]   [facility] [address] [place] [phone]   Lucianne Lei [address], [place]   [phone] Only accepts [place] Access [place] patients after they have their name applied to their card.   Self-Pay (no insurance) in [facility]:  Organization         Address  Phone   Notes  Sickle Cell Patients, [facility] [place] [phone]   [facility] [phone]   [facility] [place]  [place], [place],  [phone]   [facility]/Dr. Osei-Bonsu  [address] [place] or [address], [place] [phone] Phone number for both [place] and [place] locations is the same.  Urgent Medical and [facility] [address] [place] [phone]   [facility] [address], [place] or [address] [phone]   [facility] [address], [place] [phone], phone; [phone], fax Sees patients 1st and 3rd Saturday of every month.  Must not qualify for public or private insurance (i.e. Medicaid, Medicare, [place] Health Choice, Veterans' Benefits)  Household income should be no more than 200% of the poverty level The clinic cannot treat you if you are pregnant or think you are pregnant  Sexually transmitted diseases are not treated at the clinic.    Dental Care: Organization         Address  Phone  Notes  [facility] Department of [facility] [phone] Accepts children up to age 29 who are enrolled in [place] or [place]; pregnant women with a Medicaid card; and children who have applied for Medicaid or [place] Health Choice, but were declined, whose parents can pay a reduced fee at time of service.  [facility] Department of [facility]  [address], [place] [phone] Accepts children up to age 8 who are enrolled in [place] or [place]; pregnant women with a Medicaid card; and children who have applied for Medicaid or [place] Health Choice, but were declined, whose parents can pay a reduced fee at time of service.  [facility] Adult [facility] Access PROGRAM  [place]  [phone] Patients are seen by appointment only. Walk-ins are not accepted. [place] will see patients 5 years of age and older. Monday - Tuesday (8am-5pm) Most Wednesdays (8:30-5pm) $30 per visit, cash only  [facility] Adult Dental Access PROGRAM  [address], [facility] [phone] Patients are seen by appointment only. Walk-ins are not accepted. [place]  will see patients 44 years of age and older. One Wednesday Evening (Monthly: Volunteer Based).  $30 per visit, cash only  [place]  [phone] for adults; Children under age 36, call Graduate Pediatric Dentistry at [phone]. Children aged 39-14, please call [phone] to request a pediatric application.  Dental services are provided in all areas of dental care including fillings, crowns and bridges, complete and partial dentures, implants, gum treatment, root canals, and extractions. Preventive care is also provided. Treatment is provided to both adults and children. Patients are selected via a lottery and there is often a waiting list.   [facility] [address] [place]  [phone] [email or web address]   [facility] [address] [place], [place] [phone], Ext. [phone] Second and Fourth Thursday of each month, opens at 6:30 AM; Clinic ends at 9 AM.  Patients are seen on a first-come first-served basis, and a limited number are seen during each clinic.   [facility]  [address] [place] Danker [place], [place] [phone]   Eligibility Requirements You must have lived in [place], [place], or [place] counties for at least the last three months.   You cannot be eligible for state or federal sponsored [employer], including [employer], [place], or [employer].   You generally cannot be eligible for healthcare insurance through your employer.    How to apply: Eligibility screenings are held every Tuesday and Wednesday  afternoon from 1:00 pm until 4:00 pm. You do not need an appointment for the interview!  [facility] [address] [place], [place]   [place]  [place] Department  [place]  [phone]    Behavioral Health Resources in the Community: Intensive Outpatient Programs Organization         Address  Phone  Notes  [place]. [address] [place], [place] [phone]   [facility] Outpatient [address], [place], [place]   ADS: Alcohol & Drug Svcs [address], [place], [place]   [place] [address],  [place], [place] or [phone]   Substance Abuse Resources Organization         Address  Phone  Notes  Alcohol and Drug Services  [phone]   [place]  [phone]   [facility] [place]   Chinita Pester  [phone]   Residential & Outpatient Substance Abuse Program  [phone]   Psychological Services Organization         Address  Phone  Notes  [facility] [place]  [place]  [phone]   [place] [address] [place] or [phone]    Mobile Crisis Teams Organization         Address  Phone  Notes  [facility], Mobile Crisis Care Unit  [phone]   [facility]  [address] [place], [place]   [place] [address] [place] [phone]    Self-Help/Support Groups Organization         Address  Phone             Notes  [place]. of [place] - variety of support groups  Jeff Call for more information  Narcotics Anonymous (NA), [facility] [address] [place] [address],  [place]  2 meetings at this location   Residential Treatment  Programs Organization         Address  Phone  Notes  [facility] [address]    [place]  [phone]   [facility]  [address] [place] [phone], [place], [place]   [place], [place] [phone] Admissions: 8am-3pm M-F  Incentives Substance [place] [address],    [place], [place] [phone]   [facility] [address] [place], [place], [place]   [facility] [address],  [place], [place]   [facility] Programs - Intensive Outpatient [address], [place], [place]   [facility] ([place] [address]  [place], [place] [phone] or [phone]   [facility]idential Treatment Services (RTS) [address], [place], [place] Accepts Medicaid  [facility] [place] [address],  [place] [phone] Substance Abuse/Addiction Treatment   [facility] Organization         Address  Phone  Notes  [facility]  [phone]   Domenic Schwab, PhD [address] Arlis Port[place], [place]   [phone] or [phone]   [place], [place] [phone]   [facility] [address], [place], [place] [phone] Insurance/Medicaid/sponsorship through [facility] and Families [address]                                    [place], [place] [phone] [place] [address], [place] [phone]    Dr. Adele Schilder  [phone]   [facility] Dept. 1) [address] [place] 2) [place] 3)  [place] [address], [place] [phone]  [phone]   [place] [phone] or [phone] (After Hours)
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
## 2014-08-01 ENCOUNTER — Other Ambulatory Visit: Payer: Self-pay | Admitting: Family Medicine

## 2014-08-01 DIAGNOSIS — N200 Calculus of kidney: Secondary | ICD-10-CM

## 2014-08-04 ENCOUNTER — Ambulatory Visit
Admission: RE | Admit: 2014-08-04 | Discharge: 2014-08-04 | Disposition: A | Discharge status: Home / Self Care | Payer: 59 | Source: Ambulatory Visit | Attending: Family Medicine | Admitting: Family Medicine

## 2014-08-04 DIAGNOSIS — N200 Calculus of kidney: Secondary | ICD-10-CM

## 2015-09-08 ENCOUNTER — Ambulatory Visit: Payer: BLUE CROSS/BLUE SHIELD | Admitting: Physical Medicine & Rehabilitation

## 2016-03-10 ENCOUNTER — Other Ambulatory Visit: Payer: Self-pay | Admitting: Internal Medicine

## 2016-03-10 ENCOUNTER — Other Ambulatory Visit: Payer: Self-pay

## 2016-03-10 DIAGNOSIS — N644 Mastodynia: Secondary | ICD-10-CM

## 2016-03-11 ENCOUNTER — Other Ambulatory Visit: Payer: Self-pay | Admitting: Internal Medicine

## 2016-03-11 DIAGNOSIS — N644 Mastodynia: Secondary | ICD-10-CM

## 2016-03-22 ENCOUNTER — Ambulatory Visit
Admission: RE | Admit: 2016-03-22 | Discharge: 2016-03-22 | Disposition: A | Discharge status: Home / Self Care | Payer: BLUE CROSS/BLUE SHIELD | Source: Ambulatory Visit | Attending: Internal Medicine | Admitting: Internal Medicine

## 2016-03-22 DIAGNOSIS — N644 Mastodynia: Secondary | ICD-10-CM

## 2017-02-27 NOTE — [place] Notes (Signed)
Ms. Dault received her flu shot on 02/20/17 at the [facility] to LT deltoid. Lot # [id number] NDC: [id number] Mfg: [employer] Expires: 09/24/17

## 2017-05-01 ENCOUNTER — Encounter (HOSPITAL_COMMUNITY): Payer: Self-pay | Admitting: Emergency Medicine

## 2017-05-01 ENCOUNTER — Ambulatory Visit (HOSPITAL_COMMUNITY)
Admission: EM | Admit: 2017-05-01 | Discharge: 2017-05-01 | Disposition: A | Discharge status: Home / Self Care | Payer: BLUE CROSS/BLUE SHIELD | Attending: Family Medicine | Admitting: Family Medicine

## 2017-05-01 ENCOUNTER — Other Ambulatory Visit: Payer: Self-pay

## 2017-05-01 DIAGNOSIS — M25512 Pain in left shoulder: Secondary | ICD-10-CM

## 2017-05-01 DIAGNOSIS — M542 Cervicalgia: Secondary | ICD-10-CM

## 2017-05-01 MED ORDER — MELOXICAM 7.5 MG PO TABS: 7.5000 mg | ORAL_TABLET | Freq: Every day | ORAL | 0 refills | Status: DC

## 2017-05-01 MED ORDER — KETOROLAC TROMETHAMINE 30 MG/ML IJ SOLN
30.0000 mg | Freq: Once | INTRAMUSCULAR | Status: AC
  Administered 2017-05-01: 30 mg via INTRAMUSCULAR

## 2017-05-01 MED ORDER — KETOROLAC TROMETHAMINE 30 MG/ML IJ SOLN
INTRAMUSCULAR | Status: AC
  Filled 2017-05-01: qty 1

## 2017-05-01 MED ORDER — METHOCARBAMOL 500 MG PO TABS: 500.0000 mg | ORAL_TABLET | Freq: Two times a day (BID) | ORAL | 0 refills | Status: DC
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
## 2017-08-30 DIAGNOSIS — M542 Cervicalgia: Secondary | ICD-10-CM | POA: Diagnosis not present

## 2017-08-30 DIAGNOSIS — N760 Acute vaginitis: Secondary | ICD-10-CM | POA: Diagnosis not present

## 2017-08-30 DIAGNOSIS — Z0000 Encounter for general adult medical examination without abnormal findings: Secondary | ICD-10-CM | POA: Diagnosis not present

## 2017-09-13 DIAGNOSIS — D485 Neoplasm of uncertain behavior of skin: Secondary | ICD-10-CM | POA: Diagnosis not present

## 2017-09-13 DIAGNOSIS — D225 Melanocytic nevi of trunk: Secondary | ICD-10-CM | POA: Diagnosis not present

## 2017-09-13 DIAGNOSIS — L218 Other seborrheic dermatitis: Secondary | ICD-10-CM | POA: Diagnosis not present

## 2017-09-13 DIAGNOSIS — L821 Other seborrheic keratosis: Secondary | ICD-10-CM | POA: Diagnosis not present

## 2017-09-13 DIAGNOSIS — D2261 Melanocytic nevi of right upper limb, including shoulder: Secondary | ICD-10-CM | POA: Diagnosis not present

## 2017-10-10 DIAGNOSIS — L739 Follicular disorder, unspecified: Secondary | ICD-10-CM | POA: Diagnosis not present

## 2017-10-10 DIAGNOSIS — M542 Cervicalgia: Secondary | ICD-10-CM | POA: Diagnosis not present

## 2017-11-11 DIAGNOSIS — Z7689 Persons encountering health services in other specified circumstances: Secondary | ICD-10-CM | POA: Diagnosis not present

## 2017-11-11 DIAGNOSIS — S30860A Insect bite (nonvenomous) of lower back and pelvis, initial encounter: Secondary | ICD-10-CM | POA: Diagnosis not present

## 2017-11-11 DIAGNOSIS — M461 Sacroiliitis, not elsewhere classified: Secondary | ICD-10-CM | POA: Diagnosis not present

## 2017-11-11 DIAGNOSIS — W57XXXA Bitten or stung by nonvenomous insect and other nonvenomous arthropods, initial encounter: Secondary | ICD-10-CM | POA: Diagnosis not present

## 2017-11-21 DIAGNOSIS — M545 Low back pain: Secondary | ICD-10-CM | POA: Diagnosis not present

## 2017-11-21 DIAGNOSIS — Z79899 Other long term (current) drug therapy: Secondary | ICD-10-CM | POA: Diagnosis not present

## 2017-12-10 DIAGNOSIS — R300 Dysuria: Secondary | ICD-10-CM | POA: Diagnosis not present

## 2017-12-26 DIAGNOSIS — Z79899 Other long term (current) drug therapy: Secondary | ICD-10-CM | POA: Diagnosis not present

## 2017-12-26 DIAGNOSIS — Z23 Encounter for immunization: Secondary | ICD-10-CM | POA: Diagnosis not present

## 2018-01-20 DIAGNOSIS — J0100 Acute maxillary sinusitis, unspecified: Secondary | ICD-10-CM | POA: Diagnosis not present

## 2018-01-31 DIAGNOSIS — R300 Dysuria: Secondary | ICD-10-CM | POA: Diagnosis not present

## 2018-01-31 DIAGNOSIS — Z6823 Body mass index (BMI) 23.0-23.9, adult: Secondary | ICD-10-CM | POA: Diagnosis not present

## 2018-01-31 DIAGNOSIS — Z01419 Encounter for gynecological examination (general) (routine) without abnormal findings: Secondary | ICD-10-CM | POA: Diagnosis not present

## 2018-02-06 DIAGNOSIS — Z79899 Other long term (current) drug therapy: Secondary | ICD-10-CM | POA: Diagnosis not present

## 2018-03-05 DIAGNOSIS — M545 Low back pain: Secondary | ICD-10-CM | POA: Diagnosis not present

## 2018-03-13 DIAGNOSIS — M549 Dorsalgia, unspecified: Secondary | ICD-10-CM | POA: Diagnosis not present

## 2018-03-13 DIAGNOSIS — M542 Cervicalgia: Secondary | ICD-10-CM | POA: Diagnosis not present

## 2018-03-13 DIAGNOSIS — J069 Acute upper respiratory infection, unspecified: Secondary | ICD-10-CM | POA: Diagnosis not present

## 2018-03-27 DIAGNOSIS — R32 Unspecified urinary incontinence: Secondary | ICD-10-CM | POA: Diagnosis not present

## 2018-11-13 ENCOUNTER — Other Ambulatory Visit: Payer: Self-pay

## 2018-11-13 ENCOUNTER — Ambulatory Visit (LOCAL_COMMUNITY_HEALTH_CENTER): Payer: Medicaid Other

## 2018-11-13 VITALS — BP 119/79 | Ht 64.57 in | Wt 150.5 lb

## 2018-11-13 DIAGNOSIS — Z3201 Encounter for pregnancy test, result positive: Secondary | ICD-10-CM

## 2018-11-13 LAB — PREGNANCY, URINE: Preg Test, Ur: POSITIVE — AB

## 2018-11-13 NOTE — [place] Notes (Signed)
EGA 10.1 weeks and EDC 06/10/2019 based on LMP of 09/03/2018. Pt desires prenatal care at [facility]; sent to preadmit.

## 2018-11-20 DIAGNOSIS — Z3481 Encounter for supervision of other normal pregnancy, first trimester: Secondary | ICD-10-CM | POA: Insufficient documentation

## 2018-11-20 NOTE — [place] Notes (Signed)
Chart abstracted per phone interview 11/16/18 with Tawanna Solo, RN; Debera Lat, RN

## 2018-11-21 ENCOUNTER — Telehealth: Payer: Self-pay

## 2018-11-21 ENCOUNTER — Other Ambulatory Visit: Payer: Self-pay | Admitting: Advanced Practice Midwife

## 2018-11-21 ENCOUNTER — Other Ambulatory Visit: Payer: Self-pay

## 2018-11-21 ENCOUNTER — Ambulatory Visit: Payer: Self-pay | Admitting: Advanced Practice Midwife

## 2018-11-21 VITALS — BP 130/78 | Temp 98.0°F | Wt 151.0 lb

## 2018-11-21 DIAGNOSIS — F1920 Other psychoactive substance dependence, uncomplicated: Secondary | ICD-10-CM

## 2018-11-21 DIAGNOSIS — Z69011 Encounter for mental health services for perpetrator of parental child abuse: Secondary | ICD-10-CM

## 2018-11-21 DIAGNOSIS — E041 Nontoxic single thyroid nodule: Secondary | ICD-10-CM

## 2018-11-21 DIAGNOSIS — Z369 Encounter for antenatal screening, unspecified: Secondary | ICD-10-CM

## 2018-11-21 DIAGNOSIS — O0990 Supervision of high risk pregnancy, unspecified, unspecified trimester: Secondary | ICD-10-CM

## 2018-11-21 DIAGNOSIS — F329 Major depressive disorder, single episode, unspecified: Secondary | ICD-10-CM

## 2018-11-21 DIAGNOSIS — F32A Depression, unspecified: Secondary | ICD-10-CM

## 2018-11-21 DIAGNOSIS — F909 Attention-deficit hyperactivity disorder, unspecified type: Secondary | ICD-10-CM

## 2018-11-21 DIAGNOSIS — F419 Anxiety disorder, unspecified: Secondary | ICD-10-CM

## 2018-11-21 DIAGNOSIS — Z3481 Encounter for supervision of other normal pregnancy, first trimester: Secondary | ICD-10-CM

## 2018-11-21 LAB — WET PREP FOR TRICH, YEAST, CLUE
Trichomonas Exam: NEGATIVE
Yeast Exam: NEGATIVE

## 2018-11-21 LAB — URINALYSIS
Bilirubin, UA: NEGATIVE
Glucose, UA: NEGATIVE
Ketones, UA: NEGATIVE
Leukocytes,UA: NEGATIVE
Nitrite, UA: NEGATIVE
Protein,UA: NEGATIVE
RBC, UA: NEGATIVE
Specific Gravity, UA: 1.02 (ref 1.005–1.030)
Urobilinogen, Ur: 0.2 mg/dL (ref 0.2–1.0)
pH, UA: 7 (ref 5.0–7.5)

## 2018-11-21 LAB — HEMOGLOBIN, FINGERSTICK: Hemoglobin: 12.1 g/dL (ref 11.1–15.9)
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
## 2018-11-21 NOTE — Progress Notes (Signed)
In for new ob; has PNV; initial labs today Debera Lat, RN  Wet prep reviewed-no treatment indicated; informed will be called with 1st screen appt. Debera Lat, RN

## 2018-11-21 NOTE — Telephone Encounter (Signed)
Attempted to call with [facility] 1st screen appt: 11/29/18; no answer & no voicemail Debera Lat, RN

## 2018-11-22 ENCOUNTER — Telehealth: Payer: Self-pay | Admitting: General Practice

## 2018-11-22 DIAGNOSIS — O26899 Other specified pregnancy related conditions, unspecified trimester: Secondary | ICD-10-CM | POA: Insufficient documentation

## 2018-11-22 DIAGNOSIS — Z6791 Unspecified blood type, Rh negative: Secondary | ICD-10-CM | POA: Insufficient documentation

## 2018-11-22 LAB — CBC/D/PLT+RPR+RH+ABO+AB SCR
Antibody Screen: NEGATIVE
Basophils Absolute: 0 10*3/uL (ref 0.0–0.2)
Basos: 0 %
EOS (ABSOLUTE): 0 10*3/uL (ref 0.0–0.4)
Eos: 0 %
Hematocrit: 34.7 % (ref 34.0–46.6)
Hemoglobin: 11.9 g/dL (ref 11.1–15.9)
Hepatitis B Surface Ag: NEGATIVE
Immature Grans (Abs): 0 10*3/uL (ref 0.0–0.1)
Immature Granulocytes: 0 %
Lymphocytes Absolute: 1.1 10*3/uL (ref 0.7–3.1)
Lymphs: 13 %
MCH: 30.4 pg (ref 26.6–33.0)
MCHC: 34.3 g/dL (ref 31.5–35.7)
MCV: 89 fL (ref 79–97)
Monocytes Absolute: 0.8 10*3/uL (ref 0.1–0.9)
Monocytes: 9 %
Neutrophils Absolute: 6.6 10*3/uL (ref 1.4–7.0)
Neutrophils: 78 %
Platelets: 215 10*3/uL (ref 150–450)
RBC: 3.92 x10E6/uL (ref 3.77–5.28)
RDW: 12.6 % (ref 11.7–15.4)
RPR Ser Ql: NONREACTIVE
Rh Factor: NEGATIVE
WBC: 8.6 10*3/uL (ref 3.4–10.8)

## 2018-11-22 LAB — HIV ANTIBODY (ROUTINE TESTING W REFLEX): HIV 1&2 Ab, 4th Generation: NONREACTIVE

## 2018-11-22 LAB — VARICELLA ZOSTER ANTIBODY, IGG: Varicella zoster IgG: 2220 index (ref >165)

## 2018-11-22 LAB — TSH+FREE T4
Free T4: 1.13 ng/dL (ref 0.82–1.77)
TSH: 0.984 u[IU]/mL (ref 0.450–4.500)

## 2018-11-22 NOTE — Telephone Encounter (Signed)
RETURNING PHONE CALL FROM YESTERDAY. FU TO MAKE SURE ALL IS WELL.

## 2018-11-22 NOTE — Telephone Encounter (Signed)
Attempted return phone call to patient. No answer, unable to leave a message due to voicemail has not been set up. Hal Morales, RN

## 2018-11-22 NOTE — Telephone Encounter (Signed)
Returned call-informed of 11/29/18 @ 9:45 [facility] appt; instructions given Debera Lat, RN

## 2018-11-23 LAB — 789231 7+OXYCODONE-BUND
Amphetamines, Urine: NEGATIVE ng/mL
BENZODIAZ UR QL: NEGATIVE ng/mL
Barbiturate screen, urine: NEGATIVE ng/mL
Cannabinoid Quant, Ur: NEGATIVE ng/mL
Cocaine (Metab.): NEGATIVE ng/mL
OPIATE SCREEN URINE: NEGATIVE ng/mL
Oxycodone/Oxymorphone, Urine: NEGATIVE ng/mL
PCP Quant, Ur: NEGATIVE ng/mL

## 2018-11-23 LAB — URINE CULTURE

## 2018-11-24 LAB — CHLAMYDIA/GC NAA, CONFIRMATION
Chlamydia trachomatis, NAA: NEGATIVE
Neisseria gonorrhoeae, NAA: NEGATIVE

## 2018-11-26 LAB — IGP, APTIMA HPV
HPV Aptima: POSITIVE — AB
PAP Smear Comment: 0

## 2018-11-27 ENCOUNTER — Encounter: Payer: Self-pay | Admitting: Advanced Practice Midwife

## 2018-11-27 DIAGNOSIS — R87619 Unspecified abnormal cytological findings in specimens from cervix uteri: Secondary | ICD-10-CM

## 2018-11-27 HISTORY — PX: DILATION AND CURETTAGE OF UTERUS: SHX78

## 2018-11-27 HISTORY — DX: Unspecified abnormal cytological findings in specimens from cervix uteri: R87.619

## 2018-11-29 ENCOUNTER — Ambulatory Visit: Payer: Medicaid Other

## 2018-11-29 ENCOUNTER — Ambulatory Visit
Admission: RE | Admit: 2018-11-29 | Discharge: 2018-11-29 | Disposition: A | Discharge status: Home / Self Care | Payer: Medicaid Other | Source: Ambulatory Visit | Attending: Maternal & Fetal Medicine | Admitting: Maternal & Fetal Medicine

## 2018-11-29 ENCOUNTER — Other Ambulatory Visit: Payer: Self-pay | Admitting: Advanced Practice Midwife

## 2018-11-29 ENCOUNTER — Other Ambulatory Visit: Payer: Self-pay

## 2018-11-29 DIAGNOSIS — Z3A09 9 weeks gestation of pregnancy: Secondary | ICD-10-CM | POA: Diagnosis not present

## 2018-11-29 DIAGNOSIS — O09521 Supervision of elderly multigravida, first trimester: Secondary | ICD-10-CM

## 2018-11-29 DIAGNOSIS — Z369 Encounter for antenatal screening, unspecified: Secondary | ICD-10-CM | POA: Diagnosis present

## 2018-11-29 DIAGNOSIS — Z3689 Encounter for other specified antenatal screening: Secondary | ICD-10-CM | POA: Insufficient documentation

## 2018-11-29 DIAGNOSIS — O09529 Supervision of elderly multigravida, unspecified trimester: Secondary | ICD-10-CM | POA: Insufficient documentation

## 2018-11-29 DIAGNOSIS — O358XX0 Maternal care for other (suspected) fetal abnormality and damage, not applicable or unspecified: Secondary | ICD-10-CM

## 2018-11-29 NOTE — Progress Notes (Addendum)
Referring Provider: [facility] Department Length of Consultation: 23  Erica Atkins was referred to [place] for genetic counseling because of advanced maternal age. She was initially seen via telephone consultation for genetic counseling then was seen later in the day for additional in person genetic counseling following the ultrasound which revealed a cystic hygroma. The patient will be 38 years old at the time of delivery.  This note summarizes the information we discussed.    We explained that the chance of a chromosome abnormality increases with maternal age.  Chromosomes and examples of chromosome problems were reviewed.  Humans typically have 46 chromosomes in each cell, with half passed through each sperm and egg.  Any change in the number or structure of chromosomes can increase the risk of problems in the physical and mental development of a pregnancy.   Based upon age of the patient, the chance of any chromosome abnormality was 1 in 55. The chance of Down syndrome, the most common chromosome problem associated with maternal age, was 1 in 51.  The risk of chromosome problems is in addition to the 3% general population risk for birth defects and intellectual disabilities.  The greatest chance, of course, is that the baby would be born in good health.  We discussed the following prenatal screening and testing options for this pregnancy:  The chorionic villus sampling procedure is available for first trimester chromosome analysis.  This involves the withdrawal of a small amount of chorionic villi (tissue from the developing placenta).  Risk of pregnancy loss is estimated to be approximately 1 in 200 to 1 in 100 (0.5 to 1%).  There is approximately a 1% (1 in 100) chance that the CVS chromosome results will be unclear.  Chorionic villi cannot be tested for neural tube defects.     Targeted ultrasound uses high frequency sound waves to create an image of the  developing fetus.  An ultrasound is often recommended as a routine means of evaluating the pregnancy.  It is also used to screen for fetal anatomy problems (for example, a heart defect) that might be suggestive of a chromosomal or other abnormality.   Amniocentesis involves the removal of a small amount of amniotic fluid from the sac surrounding the fetus with the use of a thin needle inserted through the maternal abdomen and uterus.  Ultrasound guidance is used throughout the procedure.  Fetal cells from amniotic fluid are directly evaluated and > 99.5% of chromosome problems and > 98% of open neural tube defects can be detected. This procedure is generally performed after the 15th week of pregnancy.  The main risks to this procedure include complications leading to miscarriage in less than 1 in 200 cases (0.5%).  We also reviewed the availability of cell free fetal DNA testing from maternal blood to determine whether or not the baby may have either Down syndrome, trisomy 81, or trisomy 65.  This test utilizes a maternal blood sample and DNA sequencing technology to isolate circulating cell free fetal DNA from maternal plasma.  The fetal DNA can then be analyzed for DNA sequences that are derived from the three most common chromosomes involved in aneuploidy, chromosomes 13, 18, and 21.  If the overall amount of DNA is greater than the expected level for any of these chromosomes, aneuploidy is suspected.  While we do not consider it a replacement for invasive testing and karyotype analysis, a negative result from this testing would be reassuring, though not a guarantee  of a normal chromosome complement for the baby.  An abnormal result is certainly suggestive of an abnormal chromosome complement, though we would still recommend CVS or amniocentesis to confirm any findings from this testing.  Cystic Fibrosis and Spinal Muscular Atrophy (SMA) screening were also discussed with the patient. Both conditions are  recessive, which means that both parents must be carriers in order to have a child with the disease.  Cystic fibrosis (CF) is one of the most common genetic conditions in persons of Caucasian ancestry.  This condition occurs in approximately 1 in 2,500 Caucasian persons and results in thickened secretions in the lungs, digestive, and reproductive systems.  For a baby to be at risk for having CF, both of the parents must be carriers for this condition.  Approximately 1 in 45 Caucasian persons is a carrier for CF.  Current carrier testing looks for the most common mutations in the gene for CF and can detect approximately 90% of carriers in the Caucasian population.  This means that the carrier screening can greatly reduce, but cannot eliminate, the chance for an individual to have a child with CF.  If an individual is found to be a carrier for CF, then carrier testing would be available for the partner. As part of [place] newborn screening profile, all babies born in the state of [place] will have a two-tier screening process.  Specimens are first tested to determine the concentration of immunoreactive trypsinogen (IRT).  The top 5% of specimens with the highest IRT values then undergo DNA testing using a panel of over 40 common CF mutations. SMA is a neurodegenerative disorder that leads to atrophy of skeletal muscle and overall weakness.  This condition is also more prevalent in the Caucasian population, with 1 in 40-1 in 60 persons being a carrier and 1 in 6,000-1 in 10,000 children being affected.  There are multiple forms of the disease, with some causing death in infancy to other forms with survival into adulthood.  The genetics of SMA is complex, but carrier screening can detect up to 95% of carriers in the Caucasian population.  Similar to CF, a negative result can greatly reduce, but cannot eliminate, the chance to have a child with SMA. Hemoglobinopathy carrier screening was also offered to  the patient.  We obtained a detailed family history and pregnancy history.  This is the first pregnancy for Erica Atkins and her partner, Erica Atkins.  She had one prior pregnancy termination for personal reasons.  He has one other children.  She reported no complications or exposures in this pregnancy that would be expected to increase the risk for birth defects.  In the family history, she reported several family members with high blood pressure, diabetes, attention deficit and heart disease which we reviewed are likely the result of multifactorial inheritance, or a combination of genetic as well as lifestyle factors.  The remainder of the family history is unremarkable for birth defects, developmental delays, recurrent pregnancy loss or known chromosome abnormalities.  After consideration of the options, Erica Atkins elected to proceed with an ultrasound today and desired to have MaterniT21 testing as well as carrier screening for CF, SMA and hemoglobinopathies.  An ultrasound was performed at the time of the visit.  The gestational age was consistent with 9 weeks. The presence of a cystic hygroma with edema spreading to the head and down the back was noted. The detailed fetal anatomy could not be assessed due to early gestational age.  Please refer  to the ultrasound report for details of that study.  We then discussed that when cystic hygroma is identified on prenatal ultrasound, there is a significantly increased chance for a chromosome condition, heart defect or other genetic syndrome.  We explained that up to 60% of fetuses with a cystic hygroma have chromosome abnormalities, including Turner Syndrome, Down Syndrome and trisomy 29 or 13. We reviewed testing options including chorionic villus sampling and cell free fetal DNA analysis. When a cystic hygroma is seen on ultrasound, the chance of a chromosome condition is estimated to be 50 to 60%. This chance may be higher if other ultrasound findings are present.  We discussed that the finding of cystic hygroma is also associated with an increased risk of cardiac abnormalities (thus a fetal echocardiogram would be recommended after [redacted] weeks gestation) and other genetic syndromes. An increased risk of miscarriage is also associated with a cystic hygroma.   Turner Syndrome and Down Syndrome are the most common chromosome conditions associated with cystic hygromas. Turner Syndrome is a result of a missing sex chromosome. Turner Syndrome is associated with short stature, infertility and an increased risk of other medical complications. Intelligence is usually normal. There is high probability of miscarriage or stillbirth in a pregnancy where the fetus has Turner Syndrome. Down Syndrome is the result of an extra copy of chromosome 21. Individuals with Down Syndrome have characteristic facial features, mental retardation that is usually mild to moderate, and an increased risk of other medical complications, such as heart and bowel defects. Other findings may be also be present. Cystic hygroma has also been associated with single gene conditions such as Noonan syndrome. Additional testing may be pursued if the fetal karyotype is normal.   After consideration of these results, the patient elected to return to our clinic on December 10, 2018 at 8:30am for another ultrasound to follow these findings.  At that time, she will consider her decision regarding MaterniT21 testing, CVS, or other options. If carrier screening is desired at that time, we can draw those labs at that visit.  Erica Atkins was encouraged to call with questions or concerns and gave verbal consent for me to speak with her partner, Erica Atkins, should he call for additional details.  We can be contacted at [phone].   Tests Ordered: none today due to early gc by ultrasound   Wilburt Finlay, MS, CGC   ADDENDUM 12/10/2018: Erica Atkins was seen today in the [facility] for a viability ultrasound  which showed the fetus to be nonviable.  She met with myself and Dr. Manfred Shirts to review the results of the ultrasound as well as management and testing options.  She expressed a desire for D&C with a local OB practice with genetic testing on the tissue.  We would recommend that a POC chromosomal microarray ([facility] [id number]) and maternal cell contamination ([facility] [phone]) be ordered at the time of the procedure. We are happy to follow up with her on those results and prior to or during any future pregnancy.

## 2018-11-30 ENCOUNTER — Encounter: Payer: Self-pay | Admitting: Advanced Practice Midwife

## 2018-11-30 DIAGNOSIS — O358XX0 Maternal care for other (suspected) fetal abnormality and damage, not applicable or unspecified: Secondary | ICD-10-CM

## 2018-11-30 DIAGNOSIS — O09521 Supervision of elderly multigravida, first trimester: Secondary | ICD-10-CM

## 2018-12-06 ENCOUNTER — Other Ambulatory Visit: Payer: Self-pay

## 2018-12-06 DIAGNOSIS — O09521 Supervision of elderly multigravida, first trimester: Secondary | ICD-10-CM

## 2018-12-10 ENCOUNTER — Ambulatory Visit
Admission: RE | Admit: 2018-12-10 | Discharge: 2018-12-10 | Disposition: A | Discharge status: Home / Self Care | Payer: Medicaid Other | Source: Ambulatory Visit | Attending: Maternal & Fetal Medicine | Admitting: Maternal & Fetal Medicine

## 2018-12-10 ENCOUNTER — Telehealth: Payer: Self-pay | Admitting: Licensed Clinical Social Worker

## 2018-12-10 ENCOUNTER — Other Ambulatory Visit: Payer: Self-pay

## 2018-12-10 ENCOUNTER — Telehealth: Payer: Self-pay | Admitting: Obstetrics and Gynecology

## 2018-12-10 DIAGNOSIS — Z3A11 11 weeks gestation of pregnancy: Secondary | ICD-10-CM | POA: Insufficient documentation

## 2018-12-10 DIAGNOSIS — O021 Missed abortion: Secondary | ICD-10-CM | POA: Insufficient documentation

## 2018-12-10 DIAGNOSIS — O09521 Supervision of elderly multigravida, first trimester: Secondary | ICD-10-CM

## 2018-12-10 NOTE — Telephone Encounter (Signed)
Patient is calling to find out if there is a way to move her surgery with Dr. Glennon Mac to a more sooner day then Thursday. Patient has concerns of developing sepsis due to fetal demise and other complications. Patient is not a current patient with [facility]. Would you please have Dr. Glennon Mac advise.

## 2018-12-10 NOTE — Telephone Encounter (Signed)
Spoke with pt, assured her that she should be fine to wait until Thursday for the surgery. Pt aware that chances are her developing sepsis this quick is very low. Also Advised her if she started developing a fever or had severe pain or heavy bleeding then to go to ER. Pt just very nervous and anxious, but will call back if she thinks of any further questions. All questions were answered at this time.

## 2018-12-10 NOTE — Telephone Encounter (Signed)
Referred by Debera Lat, RN due to nononviable pregnancy. LCSW attempted to call pt. vm is not set up and unable to leave message.

## 2018-12-10 NOTE — Progress Notes (Signed)
Pt at [facility] this morning for scheduled [place] and consult.  Following [place] pt requested to see a counselor.  [facility] notified.  Spoke to Debera Lat, RN who will take care of scheduling pt with Milton Ferguson, LCSW at [facility].  Phone call also placed to [facility] with Izora Gala the [employer] because pt needs to be scheduled for a D & C at [facility] per Dr. Diamantina Monks, MFM at [facility].

## 2018-12-11 ENCOUNTER — Ambulatory Visit (INDEPENDENT_AMBULATORY_CARE_PROVIDER_SITE_OTHER): Payer: Medicaid Other | Admitting: Obstetrics and Gynecology

## 2018-12-11 ENCOUNTER — Encounter: Payer: Self-pay | Admitting: Obstetrics and Gynecology

## 2018-12-11 ENCOUNTER — Other Ambulatory Visit
Admission: RE | Admit: 2018-12-11 | Discharge: 2018-12-11 | Disposition: A | Discharge status: Home / Self Care | Payer: Medicaid Other | Source: Ambulatory Visit | Attending: Obstetrics and Gynecology | Admitting: Obstetrics and Gynecology

## 2018-12-11 ENCOUNTER — Other Ambulatory Visit: Payer: Self-pay

## 2018-12-11 ENCOUNTER — Encounter
Admission: RE | Admit: 2018-12-11 | Discharge: 2018-12-11 | Disposition: A | Discharge status: Home / Self Care | Payer: Medicaid Other | Source: Ambulatory Visit | Attending: Obstetrics and Gynecology | Admitting: Obstetrics and Gynecology

## 2018-12-11 VITALS — BP 114/78 | Ht 64.0 in | Wt 154.0 lb

## 2018-12-11 DIAGNOSIS — Z20828 Contact with and (suspected) exposure to other viral communicable diseases: Secondary | ICD-10-CM | POA: Diagnosis not present

## 2018-12-11 DIAGNOSIS — Z6791 Unspecified blood type, Rh negative: Secondary | ICD-10-CM

## 2018-12-11 DIAGNOSIS — Z01812 Encounter for preprocedural laboratory examination: Secondary | ICD-10-CM | POA: Diagnosis not present

## 2018-12-11 DIAGNOSIS — O358XX0 Maternal care for other (suspected) fetal abnormality and damage, not applicable or unspecified: Secondary | ICD-10-CM

## 2018-12-11 DIAGNOSIS — O26891 Other specified pregnancy related conditions, first trimester: Secondary | ICD-10-CM | POA: Diagnosis not present

## 2018-12-11 DIAGNOSIS — O021 Missed abortion: Secondary | ICD-10-CM | POA: Diagnosis not present

## 2018-12-11 DIAGNOSIS — O09521 Supervision of elderly multigravida, first trimester: Secondary | ICD-10-CM

## 2018-12-11 LAB — SARS CORONAVIRUS 2 (TAT 6-24 HRS): SARS Coronavirus 2: NEGATIVE
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
## 2018-12-11 NOTE — Patient Instructions (Signed)
Your procedure is scheduled on: Thursday 12/13/18 Report to [facility]. To find out your arrival time please call [phone] between 1PM - 3PM on Wednesday 12/12/18.  Remember: Instructions that are not followed completely may result in serious medical risk, up to and including death, or upon the discretion of your surgeon and anesthesiologist your surgery may need to be rescheduled.     _X__ 1. Do not eat food after midnight the night before your procedure.                 No gum chewing or hard candies. You may drink clear liquids up to 2 hours                 before you are scheduled to arrive for your surgery- DO not drink clear                 liquids within 2 hours of the start of your surgery.                 Clear Liquids include:  water, apple juice without pulp, clear carbohydrate                 drink such as Clearfast or Gatorade, Black Coffee or Tea (Do not add                 anything to coffee or tea). Diabetics water only  __X__2.  On the morning of surgery brush your teeth with toothpaste and water, you                 may rinse your mouth with mouthwash if you wish.  Do not swallow any              toothpaste of mouthwash.     _X__ 3.  No Alcohol for 24 hours before or after surgery.   _X__ 4.  Do Not Smoke or use e-cigarettes For 24 Hours Prior to Your Surgery.                 Do not use any chewable tobacco products for at least 6 hours prior to                 surgery.  ____  5.  Bring all medications with you on the day of surgery if instructed.   __X__  6.  Notify your doctor if there is any change in your medical condition      (cold, fever, infections).     Do not wear jewelry, make-up, hairpins, clips or nail polish. Do not wear lotions, powders, or perfumes.  Do not shave 48 hours prior to surgery. Men may shave face and neck. Do not bring valuables to the hospital.    [facility] is not responsible  for any belongings or valuables.  Contacts, dentures/partials or body piercings may not be worn into surgery. Bring a case for your contacts, glasses or hearing aids, a denture cup will be supplied. Leave your suitcase in the car. After surgery it may be brought to your room. For patients admitted to the hospital, discharge time is determined by your treatment team.   Patients discharged the day of surgery will not be allowed to drive home.   Please read over the following fact sheets that you were given:   MRSA Information  __X__ Take these medicines the morning of surgery with A SIP OF WATER:  1.   2.   3.   4.  5.  6.  ____ Fleet Enema (as directed)   __X__ Use CHG Soap/SAGE wipes as directed  ____ Use inhalers on the day of surgery  ____ Stop metformin/Janumet/Farxiga 2 days prior to surgery    ____ Take 1/2 of usual insulin dose the night before surgery. No insulin the morning          of surgery.   ____ Stop Blood Thinners Coumadin/Plavix/Xarelto/Pleta/Pradaxa/Eliquis/Effient/Aspirin  on   Or contact your Surgeon, Cardiologist or Medical Doctor regarding  ability to stop your blood thinners  __X__ Stop Anti-inflammatories 7 days before surgery such as Advil, Ibuprofen, Motrin,  BC or Goodies Powder, Naprosyn, Naproxen, Aleve, Aspirin    __X__ Stop all herbal supplements, fish oil or vitamin E until after surgery.    ____ Bring C-Pap to the hospital.      
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
## 2018-12-11 NOTE — Telephone Encounter (Signed)
Scheduled appointment

## 2018-12-12 MED ORDER — DOXYCYCLINE HYCLATE 100 MG IV SOLR
200.0000 mg | INTRAVENOUS | Status: AC
  Administered 2018-12-13: 200 mg via INTRAVENOUS
  Filled 2018-12-12: qty 200

## 2018-12-13 ENCOUNTER — Ambulatory Visit
Admission: RE | Admit: 2018-12-13 | Discharge: 2018-12-13 | Disposition: A | Discharge status: Home / Self Care | Payer: Medicaid Other | Attending: Obstetrics and Gynecology | Admitting: Obstetrics and Gynecology

## 2018-12-13 ENCOUNTER — Ambulatory Visit: Payer: Medicaid Other | Admitting: Anesthesiology

## 2018-12-13 ENCOUNTER — Encounter
Admission: RE | Disposition: A | Discharge status: Home / Self Care | Payer: Self-pay | Source: Home / Self Care | Attending: Obstetrics and Gynecology

## 2018-12-13 ENCOUNTER — Encounter: Payer: Self-pay | Admitting: Emergency Medicine

## 2018-12-13 ENCOUNTER — Other Ambulatory Visit: Payer: Self-pay

## 2018-12-13 DIAGNOSIS — D181 Lymphangioma, any site: Secondary | ICD-10-CM | POA: Insufficient documentation

## 2018-12-13 DIAGNOSIS — Z833 Family history of diabetes mellitus: Secondary | ICD-10-CM | POA: Insufficient documentation

## 2018-12-13 DIAGNOSIS — Z3A09 9 weeks gestation of pregnancy: Secondary | ICD-10-CM | POA: Insufficient documentation

## 2018-12-13 DIAGNOSIS — O09521 Supervision of elderly multigravida, first trimester: Secondary | ICD-10-CM

## 2018-12-13 DIAGNOSIS — Z8249 Family history of ischemic heart disease and other diseases of the circulatory system: Secondary | ICD-10-CM | POA: Diagnosis not present

## 2018-12-13 DIAGNOSIS — G8929 Other chronic pain: Secondary | ICD-10-CM | POA: Diagnosis not present

## 2018-12-13 DIAGNOSIS — Z811 Family history of alcohol abuse and dependence: Secondary | ICD-10-CM | POA: Insufficient documentation

## 2018-12-13 DIAGNOSIS — Z9109 Other allergy status, other than to drugs and biological substances: Secondary | ICD-10-CM | POA: Insufficient documentation

## 2018-12-13 DIAGNOSIS — F329 Major depressive disorder, single episode, unspecified: Secondary | ICD-10-CM | POA: Diagnosis not present

## 2018-12-13 DIAGNOSIS — Z8349 Family history of other endocrine, nutritional and metabolic diseases: Secondary | ICD-10-CM | POA: Diagnosis not present

## 2018-12-13 DIAGNOSIS — F988 Other specified behavioral and emotional disorders with onset usually occurring in childhood and adolescence: Secondary | ICD-10-CM | POA: Diagnosis not present

## 2018-12-13 DIAGNOSIS — O021 Missed abortion: Secondary | ICD-10-CM

## 2018-12-13 DIAGNOSIS — E739 Lactose intolerance, unspecified: Secondary | ICD-10-CM | POA: Insufficient documentation

## 2018-12-13 DIAGNOSIS — M542 Cervicalgia: Secondary | ICD-10-CM | POA: Insufficient documentation

## 2018-12-13 DIAGNOSIS — M25519 Pain in unspecified shoulder: Secondary | ICD-10-CM | POA: Diagnosis not present

## 2018-12-13 DIAGNOSIS — Z6791 Unspecified blood type, Rh negative: Secondary | ICD-10-CM

## 2018-12-13 DIAGNOSIS — G709 Myoneural disorder, unspecified: Secondary | ICD-10-CM | POA: Diagnosis not present

## 2018-12-13 DIAGNOSIS — Z86718 Personal history of other venous thrombosis and embolism: Secondary | ICD-10-CM | POA: Insufficient documentation

## 2018-12-13 DIAGNOSIS — O09529 Supervision of elderly multigravida, unspecified trimester: Secondary | ICD-10-CM | POA: Diagnosis present

## 2018-12-13 DIAGNOSIS — O358XX0 Maternal care for other (suspected) fetal abnormality and damage, not applicable or unspecified: Secondary | ICD-10-CM

## 2018-12-13 DIAGNOSIS — Z87440 Personal history of urinary (tract) infections: Secondary | ICD-10-CM | POA: Insufficient documentation

## 2018-12-13 DIAGNOSIS — O26899 Other specified pregnancy related conditions, unspecified trimester: Secondary | ICD-10-CM

## 2018-12-13 HISTORY — PX: DILATION AND EVACUATION: SHX1459

## 2018-12-13 LAB — TYPE AND SCREEN
ABO/RH(D): B NEG
Antibody Screen: NEGATIVE

## 2018-12-13 SURGERY — DILATION AND EVACUATION, UTERUS
Anesthesia: General

## 2018-12-13 MED ORDER — FENTANYL CITRATE (PF) 100 MCG/2ML IJ SOLN
INTRAMUSCULAR | Status: AC
  Administered 2018-12-13: 25 ug via INTRAVENOUS
  Filled 2018-12-13: qty 2

## 2018-12-13 MED ORDER — LACTATED RINGERS IV SOLN: INTRAVENOUS | Status: DC

## 2018-12-13 MED ORDER — ONDANSETRON HCL 4 MG/2ML IJ SOLN
INTRAMUSCULAR | Status: DC | PRN
  Administered 2018-12-13: 4 mg via INTRAVENOUS

## 2018-12-13 MED ORDER — OXYCODONE HCL 5 MG/5ML PO SOLN: 5.0000 mg | Freq: Once | ORAL | Status: AC | PRN

## 2018-12-13 MED ORDER — LIDOCAINE HCL (CARDIAC) PF 100 MG/5ML IV SOSY
PREFILLED_SYRINGE | INTRAVENOUS | Status: DC | PRN
  Administered 2018-12-13: 60 mg via INTRAVENOUS

## 2018-12-13 MED ORDER — IBUPROFEN 600 MG PO TABS
ORAL_TABLET | ORAL | Status: AC
  Administered 2018-12-13: 12:00:00 600 mg via ORAL
  Filled 2018-12-13: qty 1

## 2018-12-13 MED ORDER — PROMETHAZINE HCL 25 MG/ML IJ SOLN: 6.2500 mg | INTRAMUSCULAR | Status: DC | PRN

## 2018-12-13 MED ORDER — KETOROLAC TROMETHAMINE 30 MG/ML IJ SOLN
INTRAMUSCULAR | Status: AC
  Administered 2018-12-13: 30 mg via INTRAVENOUS
  Filled 2018-12-13: qty 1

## 2018-12-13 MED ORDER — OXYCODONE HCL 5 MG PO TABS
ORAL_TABLET | ORAL | Status: AC
  Filled 2018-12-13: qty 1

## 2018-12-13 MED ORDER — LACTATED RINGERS IV SOLN
INTRAVENOUS | Status: DC
  Administered 2018-12-13: 07:00:00 via INTRAVENOUS

## 2018-12-13 MED ORDER — FENTANYL CITRATE (PF) 100 MCG/2ML IJ SOLN
INTRAMUSCULAR | Status: AC
  Filled 2018-12-13: qty 2

## 2018-12-13 MED ORDER — TRAMADOL HCL 50 MG PO TABS: 50.0000 mg | ORAL_TABLET | Freq: Four times a day (QID) | ORAL | 0 refills | Status: DC | PRN

## 2018-12-13 MED ORDER — RHO D IMMUNE GLOBULIN 1500 UNIT/2ML IJ SOSY
300.0000 ug | PREFILLED_SYRINGE | Freq: Once | INTRAMUSCULAR | Status: AC
  Administered 2018-12-13: 300 ug via INTRAVENOUS
  Filled 2018-12-13: qty 2

## 2018-12-13 MED ORDER — HYDROXYZINE HCL 25 MG PO TABS: 25.0000 mg | ORAL_TABLET | Freq: Four times a day (QID) | ORAL | 0 refills | Status: DC | PRN

## 2018-12-13 MED ORDER — ONDANSETRON HCL 4 MG/2ML IJ SOLN
INTRAMUSCULAR | Status: AC
  Filled 2018-12-13: qty 2

## 2018-12-13 MED ORDER — METHYLERGONOVINE MALEATE 0.2 MG/ML IJ SOLN
INTRAMUSCULAR | Status: DC | PRN
  Administered 2018-12-13: 0.2 mg via INTRAMUSCULAR

## 2018-12-13 MED ORDER — FENTANYL CITRATE (PF) 100 MCG/2ML IJ SOLN
25.0000 ug | INTRAMUSCULAR | Status: DC | PRN
  Administered 2018-12-13 (×5): 25 ug via INTRAVENOUS

## 2018-12-13 MED ORDER — DEXAMETHASONE SODIUM PHOSPHATE 10 MG/ML IJ SOLN
INTRAMUSCULAR | Status: DC | PRN
  Administered 2018-12-13: 10 mg via INTRAVENOUS

## 2018-12-13 MED ORDER — MIDAZOLAM HCL 2 MG/2ML IJ SOLN
INTRAMUSCULAR | Status: DC | PRN
  Administered 2018-12-13: 2 mg via INTRAVENOUS

## 2018-12-13 MED ORDER — SCOPOLAMINE 1 MG/3DAYS TD PT72
1.0000 | MEDICATED_PATCH | TRANSDERMAL | Status: DC
  Administered 2018-12-13: 1.5 mg via TRANSDERMAL

## 2018-12-13 MED ORDER — FAMOTIDINE 20 MG PO TABS
20.0000 mg | ORAL_TABLET | Freq: Once | ORAL | Status: AC
  Administered 2018-12-13: 20 mg via ORAL

## 2018-12-13 MED ORDER — FENTANYL CITRATE (PF) 100 MCG/2ML IJ SOLN
INTRAMUSCULAR | Status: DC | PRN
  Administered 2018-12-13 (×2): 25 ug via INTRAVENOUS

## 2018-12-13 MED ORDER — IBUPROFEN 600 MG PO TABS: 600.0000 mg | ORAL_TABLET | Freq: Four times a day (QID) | ORAL | 0 refills | Status: DC | PRN

## 2018-12-13 MED ORDER — DOXYCYCLINE HYCLATE 100 MG PO CAPS: 100.0000 mg | ORAL_CAPSULE | Freq: Every day | ORAL | 0 refills | Status: AC

## 2018-12-13 MED ORDER — SILVER NITRATE-POT NITRATE 75-25 % EX MISC
CUTANEOUS | Status: AC
  Filled 2018-12-13: qty 1

## 2018-12-13 MED ORDER — ACETAMINOPHEN 500 MG PO TABS
1000.0000 mg | ORAL_TABLET | Freq: Once | ORAL | Status: AC
  Administered 2018-12-13: 12:00:00 1000 mg via ORAL

## 2018-12-13 MED ORDER — ACETAMINOPHEN 500 MG PO TABS
ORAL_TABLET | ORAL | Status: AC
  Administered 2018-12-13: 1000 mg via ORAL
  Filled 2018-12-13: qty 2

## 2018-12-13 MED ORDER — MIDAZOLAM HCL 2 MG/2ML IJ SOLN
INTRAMUSCULAR | Status: AC
  Filled 2018-12-13: qty 2

## 2018-12-13 MED ORDER — DEXAMETHASONE SODIUM PHOSPHATE 10 MG/ML IJ SOLN
INTRAMUSCULAR | Status: AC
  Filled 2018-12-13: qty 1

## 2018-12-13 MED ORDER — PROPOFOL 10 MG/ML IV BOLUS
INTRAVENOUS | Status: DC | PRN
  Administered 2018-12-13: 50 mg via INTRAVENOUS
  Administered 2018-12-13: 150 mg via INTRAVENOUS

## 2018-12-13 MED ORDER — PROPOFOL 10 MG/ML IV BOLUS
INTRAVENOUS | Status: AC
  Filled 2018-12-13: qty 20

## 2018-12-13 MED ORDER — OXYCODONE HCL 5 MG PO TABS
ORAL_TABLET | ORAL | Status: AC
  Administered 2018-12-13: 5 mg via ORAL
  Filled 2018-12-13: qty 1

## 2018-12-13 MED ORDER — OXYCODONE HCL 5 MG PO TABS
5.0000 mg | ORAL_TABLET | Freq: Once | ORAL | Status: AC
  Administered 2018-12-13: 11:00:00 5 mg via ORAL

## 2018-12-13 MED ORDER — SILVER NITRATE-POT NITRATE 75-25 % EX MISC
CUTANEOUS | Status: DC | PRN
  Administered 2018-12-13: 2 via TOPICAL

## 2018-12-13 MED ORDER — IBUPROFEN 600 MG PO TABS
600.0000 mg | ORAL_TABLET | Freq: Once | ORAL | Status: AC
  Administered 2018-12-13: 12:00:00 600 mg via ORAL
  Filled 2018-12-13: qty 1

## 2018-12-13 MED ORDER — KETOROLAC TROMETHAMINE 30 MG/ML IJ SOLN
30.0000 mg | Freq: Once | INTRAMUSCULAR | Status: AC
  Administered 2018-12-13: 13:00:00 30 mg via INTRAVENOUS

## 2018-12-13 MED ORDER — SCOPOLAMINE 1 MG/3DAYS TD PT72
MEDICATED_PATCH | TRANSDERMAL | Status: AC
  Filled 2018-12-13: qty 1

## 2018-12-13 MED ORDER — OXYCODONE HCL 5 MG PO TABS
5.0000 mg | ORAL_TABLET | Freq: Once | ORAL | Status: AC | PRN
  Administered 2018-12-13: 10:00:00 5 mg via ORAL

## 2018-12-13 MED ORDER — LIDOCAINE HCL (PF) 2 % IJ SOLN
INTRAMUSCULAR | Status: AC
  Filled 2018-12-13: qty 10

## 2018-12-13 MED ORDER — FAMOTIDINE 20 MG PO TABS
ORAL_TABLET | ORAL | Status: AC
  Filled 2018-12-13: qty 1

## 2018-12-13 SURGICAL SUPPLY — 22 items
BAG URINE DRAINAGE (UROLOGICAL SUPPLIES) IMPLANT
CATH FOLEY 2WAY  5CC 16FR (CATHETERS)
CATH ROBINSON RED A/P 16FR (CATHETERS) ×2 IMPLANT
CATH URTH 16FR FL 2W BLN LF (CATHETERS) IMPLANT
COVER WAND RF STERILE (DRAPES) ×2 IMPLANT
FILTER UTR ASPR SPEC (MISCELLANEOUS) ×1 IMPLANT
FLTR UTR ASPR SPEC (MISCELLANEOUS) ×2
GLOVE BIO SURGEON STRL SZ7 (GLOVE) ×2 IMPLANT
GOWN STRL REUS W/ TWL LRG LVL3 (GOWN DISPOSABLE) ×2 IMPLANT
GOWN STRL REUS W/TWL LRG LVL3 (GOWN DISPOSABLE) ×2
KIT BERKELEY 1ST TRIMESTER 3/8 (MISCELLANEOUS) ×2 IMPLANT
KIT TURNOVER CYSTO (KITS) ×2 IMPLANT
NS IRRIG 500ML POUR BTL (IV SOLUTION) ×2 IMPLANT
PACK DNC HYST (MISCELLANEOUS) ×2 IMPLANT
PAD OB MATERNITY 4.3X12.25 (PERSONAL CARE ITEMS) ×2 IMPLANT
PAD PREP 24X41 OB/GYN DISP (PERSONAL CARE ITEMS) ×2 IMPLANT
SET BERKELEY SUCTION TUBING (SUCTIONS) ×2 IMPLANT
TOWEL OR 17X26 4PK STRL BLUE (TOWEL DISPOSABLE) ×2 IMPLANT
VACURETTE 10 RIGID CVD (CANNULA) ×1 IMPLANT
VACURETTE 12 RIGID CVD (CANNULA) ×1 IMPLANT
VACURETTE 8 RIGID CVD (CANNULA) ×1 IMPLANT
VACURETTE 8MM F TIP (MISCELLANEOUS) ×1 IMPLANT

## 2018-12-13 NOTE — Anesthesia Procedure Notes (Signed)
Procedure Name: LMA Insertion Date/Time: 12/13/2018 7:54 AM Performed by: Aline Brochure, CRNA Pre-anesthesia Checklist: Patient identified, Emergency Drugs available, Suction available and Patient being monitored Patient Re-evaluated:Patient Re-evaluated prior to induction Oxygen Delivery Method: Circle system utilized Preoxygenation: Pre-oxygenation with 100% oxygen Induction Type: IV induction Ventilation: Mask ventilation without difficulty LMA: LMA inserted LMA Size: 3.5 Number of attempts: 1 Placement Confirmation: positive ETCO2 and breath sounds checked- equal and bilateral Tube secured with: Tape Dental Injury: Teeth and Oropharynx as per pre-operative assessment

## 2018-12-13 NOTE — Op Note (Signed)
  Operative Note    Pre-Operative Diagnosis:  1) Missed abortion at approximately [redacted] weeks gestation 2) advanced maternal age, first trimester 3) Cystic hygroma, embryo  Post-Operative Diagnosis:  1) Missed abortion at approximately [redacted] weeks gestation 2) advanced maternal age, first trimester 3) Cystic hygroma, embryo  Procedures:  Suction, dilation and curettage  Primary Surgeon: Prentice Docker, MD   EBL: 400 mL   IVF: 500 mL cyrstalloid  Urine output: 100 mL  Specimens:  1) Products of conception to general pathology 2) products of conception for microarray analysis  Drains: none  Complications: None   Disposition: PACU   Condition: Stable   Findings: retroverted uterus at approximately [redacted] weeks gestation  Procedure Summary:  After informed consent was confirmed, the patient was taken to the operating room where general anesthesia was induced.  Her legs were carefully placed in the candy cane stirrups.  She was prepped and draped in the standard fashion.  A speculum was placed in the vagina and the cervix was prepped with betadine.   A tenaculum was placed on the anterior lip of the cervix.  The cervix was serially dilated to 8 mm.  The 8 mm flexible suction curette was advanced to the uterine fundus.  Two passes were made with the suction curette with evacuation of adequate tissue noted.  Two passes were made using sharp curettage until a gritty texture was noted.  One final pass was made with the suction curette.  All instruments were removed from the vagina and the cervix was noted to be hemostatic after removal of the tenaculum and application of silver nitrate.  The patient was given Methergine 0.2 mg at the end of the procedure to ensure ongoing hemostasis.  The patient tolerated the procedure well.  Sponge, lap, needle, and instrument counts were correct x 2.  VTE prophylaxis: SCDs. Antibiotic prophylaxis: doxycycline 200 mg IV. She was awakened in the operating room  and was taken to the PACU in stable condition.   Prentice Docker, MD 12/13/2018 8:41 AM

## 2018-12-13 NOTE — Anesthesia Postprocedure Evaluation (Signed)
Anesthesia Post Note  Patient: Erica Atkins  Procedure(s) Performed: DILATATION AND EVACUATION (N/A )  Patient location during evaluation: PACU Anesthesia Type: General Level of consciousness: awake and alert Pain management: pain level controlled Vital Signs Assessment: post-procedure vital signs reviewed and stable Respiratory status: spontaneous breathing, nonlabored ventilation and respiratory function stable Cardiovascular status: blood pressure returned to baseline and stable Postop Assessment: no apparent nausea or vomiting Anesthetic complications: no     Last Vitals:  Vitals:   12/13/18 0953 12/13/18 1141  BP: 131/82 123/74  Pulse: 72 77  Resp: 16 18  Temp: 36.4 C   SpO2: 98% 99%    Last Pain:  Vitals:   12/13/18 1141  TempSrc:   PainSc: [place]
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
## 2018-12-13 NOTE — Anesthesia Post-op Follow-up Note (Signed)
Anesthesia QCDR form completed.        

## 2018-12-13 NOTE — Transfer of Care (Signed)
Immediate Anesthesia Transfer of Care Note  Patient: Erica Atkins  Procedure(s) Performed: DILATATION AND EVACUATION (N/A )  Patient Location: PACU  Anesthesia Type:General  Level of Consciousness: sedated  Airway & Oxygen Therapy: Patient connected to face mask oxygen  Post-op Assessment: Post -op Vital signs reviewed and stable  Post vital signs: stable  Last Vitals:  Vitals Value Taken Time  BP 125/88 12/13/18 0846  Temp    Pulse 83 12/13/18 0846  Resp 10 12/13/18 0846  SpO2 100 % 12/13/18 0846    Last Pain:  Vitals:   12/13/18 0635  TempSrc: Tympanic  PainSc: 3          Complications: No apparent anesthesia complications

## 2018-12-13 NOTE — Addendum Note (Signed)
Addended by: Cletis Media on: 12/13/2018 10:57 AM   Modules accepted: Orders

## 2018-12-13 NOTE — Discharge Instructions (Signed)

## 2018-12-13 NOTE — Interval H&P Note (Signed)
History and Physical Interval Note:  12/13/2018 7:29 AM  Erica Atkins  has presented today for surgery, with the diagnosis of Missed abortion.  The various methods of treatment have been discussed with the patient and family. After consideration of risks, benefits and other options for treatment, the patient has consented to  Procedure(s): DILATATION AND EVACUATION (N/A) as a surgical intervention.  The patient's history has been reviewed, patient examined, no change in status, stable for surgery.  I have reviewed the patient's chart and labs.  Questions were answered to the patient's satisfaction.    Prentice Docker, MD, [facility], [facility] 12/13/2018 7:29 AM

## 2018-12-14 ENCOUNTER — Telehealth: Payer: Self-pay

## 2018-12-14 LAB — RHOGAM INJECTION: Unit division: 0

## 2018-12-14 NOTE — Telephone Encounter (Signed)
Pt states she is feeling better , tramadol is helping. Pt is resting and will call if she has any issues before her PO appt. Pt appreciative of my call and thanked [place] for everything

## 2018-12-14 NOTE — Telephone Encounter (Signed)
Tried to call pt for her PO check up. Just wanted to see how she was doing today. Lm with pt to call back. Please let me know when she calls back

## 2018-12-14 NOTE — Telephone Encounter (Signed)
Patient is returning missed calls. Please advise

## 2018-12-14 NOTE — Telephone Encounter (Signed)
Please call back to check up on her pain level today. thanks

## 2018-12-17 ENCOUNTER — Telehealth: Payer: Self-pay

## 2018-12-17 NOTE — Telephone Encounter (Signed)
Pt is calling stating that since her D&C she's been having blurry vision. Pt is asking if this is a normal reaction or if this is something she needs to be seen for by a different doctor. Pt states she has never had glasses before or needed them so this is out of the ordinary for her. Please advise in SDJ absence.

## 2018-12-17 NOTE — Telephone Encounter (Signed)
Not a typical side effect of surgery or anesthesia, but would give time and discuss w Dr Glennon Mac at appt if it persists

## 2018-12-18 ENCOUNTER — Encounter: Payer: Medicaid Other | Admitting: Obstetrics and Gynecology

## 2018-12-18 NOTE — Telephone Encounter (Signed)
Pt aware.

## 2018-12-19 ENCOUNTER — Ambulatory Visit: Payer: Self-pay

## 2018-12-24 ENCOUNTER — Encounter: Payer: Self-pay | Admitting: Obstetrics and Gynecology

## 2018-12-24 ENCOUNTER — Other Ambulatory Visit (HOSPITAL_COMMUNITY)
Admission: RE | Admit: 2018-12-24 | Discharge: 2018-12-24 | Disposition: A | Discharge status: Home / Self Care | Payer: Medicaid Other | Source: Ambulatory Visit | Attending: Obstetrics and Gynecology | Admitting: Obstetrics and Gynecology

## 2018-12-24 ENCOUNTER — Ambulatory Visit (INDEPENDENT_AMBULATORY_CARE_PROVIDER_SITE_OTHER): Payer: Medicaid Other | Admitting: Obstetrics and Gynecology

## 2018-12-24 ENCOUNTER — Other Ambulatory Visit: Payer: Self-pay

## 2018-12-24 VITALS — BP 122/74 | Ht 64.0 in | Wt 153.0 lb

## 2018-12-24 DIAGNOSIS — R87610 Atypical squamous cells of undetermined significance on cytologic smear of cervix (ASC-US): Secondary | ICD-10-CM

## 2018-12-24 DIAGNOSIS — N72 Inflammatory disease of cervix uteri: Secondary | ICD-10-CM

## 2018-12-24 DIAGNOSIS — B977 Papillomavirus as the cause of diseases classified elsewhere: Secondary | ICD-10-CM

## 2018-12-24 DIAGNOSIS — N870 Mild cervical dysplasia: Secondary | ICD-10-CM

## 2018-12-24 DIAGNOSIS — Z09 Encounter for follow-up examination after completed treatment for conditions other than malignant neoplasm: Secondary | ICD-10-CM
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
## 2018-12-24 NOTE — Progress Notes (Signed)
   Postoperative Follow-up Patient presents post op from Dilation and evacuation 10days ago for missed abortion (miscarriage).  Subjective: Patient reports marked improvement in her preop symptoms. Eating a regular diet without difficulty. The patient is not having any pain.  Activity: normal activities of daily living. She denies fevers, chills. She is having normal bowel and bladder habits.  Objective: Vitals:   12/24/18 1036  BP: 122/74   Vital Signs: BP 122/74   Ht 5\' 4"  (1.626 m)   Wt 153 lb (69.4 kg)   LMP 09/03/2018 (Exact Date) Comment: abnormal, had Mirena IUD removed June 2020  BMI 26.26 kg/m  Constitutional: Well nourished, well developed female in no acute distress.  HEENT: normal Skin: Warm and dry.  Extremity: no edema  Abdomen: Soft, non-tender, normal bowel sounds; no bruits, organomegaly or masses.   Pelvic exam: (female chaperone present) is not limited by body habitus EGBUS: within normal limits Vagina: within normal limits and with normal mucosa blood in the vault Cervix: see other other from colposcopy procedure    Assessment: 38 y.o. s/p suction, dilation and evacuation progressing well  Plan: Patient has done well after surgery with no apparent complications.  I have discussed the post-operative course to date, and the expected progress moving forward.  The patient understands what complications to be concerned about.  I will see the patient in routine follow up, or sooner if needed.    Activity plan: pelvic reset x 4 weeks  This part of the visit is covered under the global charge of the surgery.   Prentice Docker 12/24/2018, 10:51 AM

## 2018-12-25 ENCOUNTER — Encounter: Payer: Self-pay | Admitting: Obstetrics and Gynecology

## 2018-12-26 ENCOUNTER — Other Ambulatory Visit: Payer: Self-pay

## 2018-12-27 ENCOUNTER — Ambulatory Visit: Payer: Self-pay | Admitting: Licensed Clinical Social Worker

## 2018-12-28 LAB — SURGICAL PATHOLOGY

## 2018-12-29 LAB — POC/TISSUE MICROARRAY

## 2018-12-31 ENCOUNTER — Ambulatory Visit: Payer: Self-pay | Admitting: Licensed Clinical Social Worker

## 2019-01-02 ENCOUNTER — Telehealth: Payer: Self-pay | Admitting: Obstetrics and Gynecology

## 2019-01-02 LAB — SURGICAL PATHOLOGY

## 2019-01-02 NOTE — Telephone Encounter (Signed)
Attempted to call patient. No answer and voicemail not set up yet.

## 2019-01-07 ENCOUNTER — Ambulatory Visit: Payer: Medicaid Other | Admitting: Licensed Clinical Social Worker

## 2019-01-07 ENCOUNTER — Encounter: Payer: Self-pay | Admitting: Licensed Clinical Social Worker

## 2019-01-07 DIAGNOSIS — F32A Depression, unspecified: Secondary | ICD-10-CM

## 2019-01-07 DIAGNOSIS — F329 Major depressive disorder, single episode, unspecified: Secondary | ICD-10-CM
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
## 2019-01-14 ENCOUNTER — Ambulatory Visit: Payer: Medicaid Other | Admitting: Licensed Clinical Social Worker

## 2019-01-14 DIAGNOSIS — F329 Major depressive disorder, single episode, unspecified: Secondary | ICD-10-CM

## 2019-01-14 DIAGNOSIS — F32A Depression, unspecified: Secondary | ICD-10-CM

## 2019-01-14 NOTE — Progress Notes (Signed)
Counselor/Therapist Progress Note  Patient ID: Erica Atkins, MRN: [id number],    Date: 01/14/2019  Time Spent: 32 minutes patient was late for session  Treatment Type: Psychotherapy  Reported Symptoms: Obsessive thinking and sadness, depressed mood, ruminating thoughts  Mental Status Exam:  Appearance:   NA     Behavior:  Appropriate and Sharing  Motor:  na  Speech/Language:   NA  Affect:  NA  Mood:  dysthymic and sad  Thought process:  normal  Thought content:    WNL  Sensory/Perceptual disturbances:    WNL  Orientation:  oriented to person, place and time/date  Attention:  Good  Concentration:  Good  Memory:  WNL  Fund of knowledge:   Good  Insight:    Good  Judgment:   Good  Impulse Control:  Good   Risk Assessment: Danger to Self:  No Self-injurious Behavior: No Danger to Others: No Duty to Warn:no Physical Aggression / Violence:No  Access to Firearms a concern: No  Gang Involvement:No   Subjective: Patient was engaged and cooperative throughout the session using time effectively to discuss thoughts and feelings around grief and loss Patient voices continued motivation for treatment and understanding of depression/greif. Patient is likely to benefit from future treatment because she remains motivated to decrease symptoms and improve functioning.  Interventions: Cognitive Behavioral Therapy  Established psychological safety.  Checked in with patient and reviewed previous session, including assessment and goal of treatment. Reviewed CBTs. Explored patient's goal of treatment and worked collaboratively to develop CBT treatment plan. Provided support through active listening, validation of feelings, and highlighted patient's strengths.  Diagnosis:   ICD-10-CM   1. Depression, unspecified depression type  F32.9     Plan:  Patient goal is to learn to manage emotions associated with recent loss.   Treatment Target: Understand the relationship between thoughts,  emotions, and behaviors  - Psychoeducation on CBT model   - Teach the connection between thoughts, emotions, and behaviors   Treatment Target: Increase realistic balanced thinking -to learn how to replace thinking with thoughts that are more accurate or helpful - Explore patient's thoughts, beliefs, automatic thoughts, assumptions  - Identify unhelpful thinking patterns  - Process distress and allow for emotional release  - Cognitive reframing  - Questioning and challenging thoughts Treatment Target: Increased understanding of grief and loss Objectives/treatment focus: Marland Kitchen Develop vocabulary to describe feelings of grief and loss . Identify grief and loss issues  . Identify steps toward managing grief . Gain awareness, and accept that grief and loss is causing problems Accept responsibility for change / Address issues underlying feelings of grief and loss. Objectives/treatment focus: . Identify issues of grief and loss from the past and resolve or let go. Deal with uncomfortable feelings resulting from grief or loss Objectives/treatment focus: . Deal with feelings of guilt about his grief, survivor guilt, "should haves," "if onlys" . Deal with feelings of sadness . Practice Mindfulness/acceptance meditation for anxiety/worry/ruminating thoughts  Interpreter used: NA   Milton Ferguson, LCSW

## 2019-01-21 ENCOUNTER — Ambulatory Visit: Payer: Medicaid Other | Admitting: Licensed Clinical Social Worker

## 2019-01-28 ENCOUNTER — Ambulatory Visit: Payer: Medicaid Other | Admitting: Licensed Clinical Social Worker

## 2019-01-28 DIAGNOSIS — F32A Depression, unspecified: Secondary | ICD-10-CM

## 2019-01-28 DIAGNOSIS — F329 Major depressive disorder, single episode, unspecified: Secondary | ICD-10-CM

## 2019-01-28 NOTE — Progress Notes (Addendum)
Counselor/Therapist Progress Note  Patient ID: Erica Atkins, MRN: [id number],    Date: 01/28/2019  Time Spent: 45 minutes    Treatment Type: Psychotherapy  Reported Symptoms: mood improvement; times of sadness related to loss   Mental Status Exam:   Appearance:   Casual     Behavior:  Appropriate and Sharing  Motor:  Normal  Speech/Language:   Normal Rate  Affect:  NA  Mood:  normal  Thought process:  normal  Thought content:    WNL  Sensory/Perceptual disturbances:    WNL  Orientation:  oriented to person, place and time/date  Attention:  Fair  Concentration:  Good  Memory:  WNL  Fund of knowledge:   Good  Insight:    Good  Judgment:   Good  Impulse Control:  Good   Risk Assessment: Danger to Self:  No Self-injurious Behavior: No Danger to Others: No Duty to Warn:no Physical Aggression / Violence:No  Access to Firearms a concern: No  Gang Involvement:No   Subjective: Patient was engaged throughout the session and used time effectively when redirected to help with focus. Patient voices continued motivation for treatment and understanding of mood symptoms related to loss and grief. She continues to express difficulties in regulating focus due to not being on meds for ADHD. Patient is likely to benefit from future treatment because she remains motivated to decrease symptoms and improve functioning and reports benefit of sessions in addressing these symptoms.    Interventions: Cognitive Behavioral Therapy  Established psychological safety. Checked in with patient and reviewed previous session, including goal of treatment and treatment plan. Discussed patient's mood improvement due to distracting self and re-focusing on career goals. And changing self-talk. Engaged patient in processing challenges in career and encouraged her to talk with a college advisor/counselor and discussed adding values exploration to treatment plan. Reviewed and discussed CBTs. Worked collaboratively to  change CBTs treatment plan, discussing strategies to help patient meet her goal of treatment . Provided support through active listening, validation of feelings, and highlighted patient's strengths.  Diagnosis:   ICD-10-CM   1. Depression, unspecified depression type  F32.9     Plan: Patient's goal is to get help with moving forward/getting over what happened  Treatment Target: Understand the relationship between thoughts, emotions, and behaviors  - Psychoeducation on CBT model   - Teach the connection between thoughts, emotions, and behaviors   Treatment Target: Increase realistic balanced thinking  - Explore patient's thoughts, beliefs, automatic thoughts, assumptions  - Identify unhelpful thinking patterns  - Process distress and allow for emotional release  - Questioning and challenging thoughts - Cognitive reappraisal  Treatment Target: Reducing vulnerability to "emotional mind" - Values clarification   - Mindfulness practices  - Self-care - nutrition, sleep, exercise  - Increase positive events  o Activity planning   Treatment Target: Resolving Grief Objectives/treatment focus: Marland Kitchen Develop vocabulary to describe feelings of grief and loss . Identify grief and loss issues  . Identify steps toward managing grief . Gain awareness, and accept that grief and loss is causing problems Accept responsibility for change / Address issues underlying feelings of grief and loss. Objectives/treatment focus: . Identify issues of grief and loss from the past and resolve or let go. Deal with uncomfortable feelings resulting from grief or loss Objectives/treatment focus: . Deal with feelings of guilt about his grief, survivor guilt, "should haves," "if onlys" . Deal with feelings of sadness . Practice Mindfulness/acceptance meditation for anxiety/worry/ruminating thoughts  Interpreter used: NA  Milton Ferguson, LCSW

## 2019-02-11 ENCOUNTER — Ambulatory Visit: Payer: Medicaid Other | Admitting: Licensed Clinical Social Worker

## 2019-02-11 DIAGNOSIS — F329 Major depressive disorder, single episode, unspecified: Secondary | ICD-10-CM

## 2019-02-11 DIAGNOSIS — F32A Depression, unspecified: Secondary | ICD-10-CM

## 2019-02-11 NOTE — Progress Notes (Addendum)
Counselor/Therapist Progress Note  Patient ID: Erica Atkins, MRN: [id number],    Date: 02/11/2019  Time Spent: 35 minutes patient was late for phone appointment    Treatment Type: Psychotherapy  Reported Symptoms: distractible, difficulties focusing, stable mood  Mental Status Exam:   Appearance:   NA     Behavior:  Appropriate and Sharing  Motor:  NA  Speech/Language:   Normal Rate  Affect:  Appropriate  Mood:  normal  Thought process:  normal  Thought content:    WNL  Sensory/Perceptual disturbances:    WNL  Orientation:  oriented to person, place, time/date and situation  Attention:  Good  Concentration:  Good  Memory:  WNL  Fund of knowledge:   Good  Insight:    Good  Judgment:   Good  Impulse Control:  Good   Risk Assessment: Danger to Self:  No Self-injurious Behavior: No Danger to Others: No Duty to Warn:no Physical Aggression / Violence:No  Access to Firearms a concern: No  Gang Involvement:No   Subjective: Patient was engaged and cooperative throughout the session using time effectively to discuss thoughts and feelings. Patient voices benefit from treatment and increased understanding of depression and grief issues and use of coping skills. Patient voices a desire to terminate treatment at this time due to meeting goal of treatment.   Interventions: Cognitive Behavioral Therapy  Established psychological safety. Engaged patient in processing current psychosocial stressors overall mood improvement and increased acceptance of loss due to use of cognitive skills and self-talk. Reviewed mindfulness, engaged patient in mindfulness exercise, processed exercise, and encouraged patient to complete daily. Discussed patient's progress and terminated treatment. Provided support through active listening, validation of feelings, and highlighted patient's strengths.    Diagnosis:   ICD-10-CM   1. Depression, unspecified depression type  F32.9     Plan: patient to seek  treatment in future, as needed.   Interpreter used: NA  Milton Ferguson, LCSW

## 2019-04-24 ENCOUNTER — Telehealth: Payer: Self-pay

## 2019-04-24 NOTE — Telephone Encounter (Signed)
Pt calling; doesn't understand test results on baby.  Wants to know how long it takes after surgery to get preg as she is still not preg yet.  [phone] until 3pm; p 3pm [phone]  Courtesy call to pt that SDJ is out of the office today and here tomorrow.  Will send msg to him.

## 2019-04-25 NOTE — Telephone Encounter (Signed)
I tried to call both numbers listed above and there was no answer at the first and the second I was not able to leave a voicemail.

## 2019-04-25 NOTE — Telephone Encounter (Signed)
Unable to leave VM due to full mailbox.

## 2019-04-26 NOTE — Telephone Encounter (Signed)
Discussed genetic testing findings from her D&C in 11/2018 (normal). She has not been able to get pregnant since about November. DIscussed using ovulation prediction kits and the likelihood of success (1/5 couples each month) and to try for another couple of months.  Otherwise, we would try to accelerate the process given her age > 35 years.  All questions answered.

## 2019-04-26 NOTE — Telephone Encounter (Signed)
Pt returned call to after hour nurse 04/25/19 6:33pm.

## 2019-05-30 ENCOUNTER — Ambulatory Visit: Payer: Medicaid Other | Admitting: Obstetrics and Gynecology

## 2019-06-10 ENCOUNTER — Ambulatory Visit: Payer: Medicaid Other | Admitting: Obstetrics and Gynecology

## 2019-06-24 ENCOUNTER — Ambulatory Visit: Payer: Medicaid Other | Admitting: Obstetrics and Gynecology

## 2019-06-25 ENCOUNTER — Telehealth: Payer: Self-pay

## 2019-06-25 NOTE — Telephone Encounter (Signed)
Pt calling; has appt tomorrow c SDJ in [place]; had positive preg test; dark spotting now like period; doesn't know what it means.  Is she miscarrying again?  [phone]  Left detailed msg brown means it's old blood; if bleeding like a period or saturating a pad every 72min-1hr to go to ED.  Pt returned call; isn't bleeding that heavily.  Adv to keep appt tomorrow and SDJ could address all her questions then.

## 2019-06-26 ENCOUNTER — Encounter: Payer: Self-pay | Admitting: Obstetrics and Gynecology

## 2019-06-26 ENCOUNTER — Ambulatory Visit (INDEPENDENT_AMBULATORY_CARE_PROVIDER_SITE_OTHER): Payer: Medicaid Other | Admitting: Obstetrics and Gynecology

## 2019-06-26 ENCOUNTER — Other Ambulatory Visit: Payer: Self-pay

## 2019-06-26 VITALS — BP 146/89 | Ht 65.0 in | Wt 162.0 lb

## 2019-06-26 DIAGNOSIS — N8312 Corpus luteum cyst of left ovary: Secondary | ICD-10-CM | POA: Diagnosis not present

## 2019-06-26 DIAGNOSIS — N938 Other specified abnormal uterine and vaginal bleeding: Secondary | ICD-10-CM | POA: Diagnosis not present

## 2019-06-26 DIAGNOSIS — Z3201 Encounter for pregnancy test, result positive: Secondary | ICD-10-CM

## 2019-06-26 DIAGNOSIS — N912 Amenorrhea, unspecified: Secondary | ICD-10-CM

## 2019-06-26 DIAGNOSIS — O209 Hemorrhage in early pregnancy, unspecified: Secondary | ICD-10-CM

## 2019-06-26 LAB — POCT URINE PREGNANCY: Preg Test, Ur: POSITIVE — AB
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
## 2019-06-26 NOTE — Patient Instructions (Signed)
For nausea (these may be purchased over-the-counter): -Vitamin B6 (pyridoxine):  25 mg three times each day (may buy 100 mg tablet and take twice per day or try to cut into 4 equal pieces and take 1 piece three times each day).  - doxylamine (found in [employer] and other sleep agents that can be bought in the store): take 25 - 50 mg at bedtime.  May take up to 25 mg three time each day.  However, keep in mind that this might make you sleepy.  

## 2019-07-05 ENCOUNTER — Ambulatory Visit (INDEPENDENT_AMBULATORY_CARE_PROVIDER_SITE_OTHER): Payer: Medicaid Other | Admitting: Obstetrics and Gynecology

## 2019-07-05 ENCOUNTER — Encounter: Payer: Self-pay | Admitting: Obstetrics and Gynecology

## 2019-07-05 ENCOUNTER — Ambulatory Visit (INDEPENDENT_AMBULATORY_CARE_PROVIDER_SITE_OTHER): Payer: Medicaid Other

## 2019-07-05 ENCOUNTER — Other Ambulatory Visit: Payer: Self-pay

## 2019-07-05 ENCOUNTER — Other Ambulatory Visit: Payer: Self-pay | Admitting: Obstetrics and Gynecology

## 2019-07-05 ENCOUNTER — Other Ambulatory Visit (HOSPITAL_COMMUNITY)
Admission: RE | Admit: 2019-07-05 | Discharge: 2019-07-05 | Disposition: A | Discharge status: Home / Self Care | Payer: Medicaid Other | Source: Ambulatory Visit | Attending: Obstetrics and Gynecology | Admitting: Obstetrics and Gynecology

## 2019-07-05 VITALS — BP 110/80 | Wt 159.0 lb

## 2019-07-05 DIAGNOSIS — Z3A08 8 weeks gestation of pregnancy: Secondary | ICD-10-CM | POA: Diagnosis not present

## 2019-07-05 DIAGNOSIS — O26891 Other specified pregnancy related conditions, first trimester: Secondary | ICD-10-CM

## 2019-07-05 DIAGNOSIS — O360110 Maternal care for anti-D [Rh] antibodies, first trimester, not applicable or unspecified: Secondary | ICD-10-CM

## 2019-07-05 DIAGNOSIS — O0991 Supervision of high risk pregnancy, unspecified, first trimester: Secondary | ICD-10-CM

## 2019-07-05 DIAGNOSIS — O09891 Supervision of other high risk pregnancies, first trimester: Secondary | ICD-10-CM

## 2019-07-05 DIAGNOSIS — O209 Hemorrhage in early pregnancy, unspecified: Secondary | ICD-10-CM

## 2019-07-05 DIAGNOSIS — O09521 Supervision of elderly multigravida, first trimester: Secondary | ICD-10-CM

## 2019-07-05 DIAGNOSIS — Z6791 Unspecified blood type, Rh negative: Secondary | ICD-10-CM

## 2019-07-05 DIAGNOSIS — Z3A09 9 weeks gestation of pregnancy: Secondary | ICD-10-CM

## 2019-07-05 DIAGNOSIS — O0990 Supervision of high risk pregnancy, unspecified, unspecified trimester: Secondary | ICD-10-CM | POA: Insufficient documentation
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
## 2019-07-06 LAB — RPR+RH+ABO+RUB AB+AB SCR+CB...
Antibody Screen: NEGATIVE
HIV Screen 4th Generation wRfx: NONREACTIVE
Hematocrit: 37.3 % (ref 34.0–46.6)
Hemoglobin: 12.8 g/dL (ref 11.1–15.9)
Hepatitis B Surface Ag: NEGATIVE
MCH: 31.4 pg (ref 26.6–33.0)
MCHC: 34.3 g/dL (ref 31.5–35.7)
MCV: 91 fL (ref 79–97)
Platelets: 226 10*3/uL (ref 150–450)
RBC: 4.08 x10E6/uL (ref 3.77–5.28)
RDW: 12.2 % (ref 11.7–15.4)
RPR Ser Ql: NONREACTIVE
Rh Factor: NEGATIVE
Rubella Antibodies, IGG: 11.7 index (ref >0.99)
Varicella zoster IgG: 1986 index (ref >165)
WBC: 10 10*3/uL (ref 3.4–10.8)

## 2019-07-07 LAB — URINE DRUG PANEL 7
Amphetamines, Urine: NEGATIVE ng/mL
Barbiturate Quant, Ur: NEGATIVE ng/mL
Benzodiazepine Quant, Ur: NEGATIVE ng/mL
Cannabinoid Quant, Ur: NEGATIVE ng/mL
Cocaine (Metab.): NEGATIVE ng/mL
Opiate Quant, Ur: NEGATIVE ng/mL
PCP Quant, Ur: NEGATIVE ng/mL

## 2019-07-07 LAB — URINE CULTURE

## 2019-07-09 LAB — CERVICOVAGINAL ANCILLARY ONLY
Chlamydia: NEGATIVE
Comment: NEGATIVE
Comment: NORMAL
Neisseria Gonorrhea: NEGATIVE

## 2019-07-11 ENCOUNTER — Ambulatory Visit (INDEPENDENT_AMBULATORY_CARE_PROVIDER_SITE_OTHER): Payer: Medicaid Other | Admitting: Obstetrics and Gynecology

## 2019-07-11 ENCOUNTER — Encounter: Payer: Self-pay | Admitting: Obstetrics and Gynecology

## 2019-07-11 ENCOUNTER — Encounter: Payer: Medicaid Other | Admitting: Obstetrics and Gynecology

## 2019-07-11 ENCOUNTER — Other Ambulatory Visit: Payer: Self-pay

## 2019-07-11 VITALS — BP 154/96 | Wt 159.0 lb

## 2019-07-11 DIAGNOSIS — O26891 Other specified pregnancy related conditions, first trimester: Secondary | ICD-10-CM

## 2019-07-11 DIAGNOSIS — O0991 Supervision of high risk pregnancy, unspecified, first trimester: Secondary | ICD-10-CM

## 2019-07-11 DIAGNOSIS — Z3A09 9 weeks gestation of pregnancy: Secondary | ICD-10-CM

## 2019-07-11 DIAGNOSIS — Z6791 Unspecified blood type, Rh negative: Secondary | ICD-10-CM

## 2019-07-11 DIAGNOSIS — O09521 Supervision of elderly multigravida, first trimester: Secondary | ICD-10-CM

## 2019-07-11 NOTE — Progress Notes (Signed)
ROB

## 2019-07-11 NOTE — Progress Notes (Signed)
  Routine Prenatal Care Visit  Subjective  Erica Atkins is a 39 y.o. G3P0010 at [redacted]w[redacted]d being seen today for ongoing prenatal care.  She is currently monitored for the following issues for this high-risk pregnancy and has Thyroid nodule, uninodular; Chronic pain syndrome; Trapezius muscle spasm; Prenatal care, subsequent pregnancy, first trimester; Attention deficit hyperactivity disorder (ADHD); Depression; Rh negative status during pregnancy; Abnormal Pap smear of cervix; Advanced maternal age in multigravida, first trimester; Cystic hygroma of fetus in singleton pregnancy; Missed abortion; and Supervision of high risk pregnancy, antepartum on their problem list.  ----------------------------------------------------------------------------------- Patient reports no complaints.    . Vag. Bleeding: None.   . Leaking Fluid denies.  ----------------------------------------------------------------------------------- The following portions of the patient's history were reviewed and updated as appropriate: allergies, current medications, past family history, past medical history, past social history, past surgical history and problem list. Problem list updated.  Objective  Blood pressure (!) 154/96, weight 159 lb (72.1 kg), last menstrual period 05/06/2019, unknown if currently breastfeeding. Pregravid weight 153 lb (69.4 kg) Total Weight Gain 6 lb (2.722 kg) Urinalysis: Urine Protein    Urine Glucose    Fetal Status: Fetal Heart Rate (bpm): 160         General:  Alert, oriented and cooperative. Patient is in no acute distress.  Skin: Skin is warm and dry. No rash noted.   Cardiovascular: Normal heart rate noted  Respiratory: Normal respiratory effort, no problems with respiration noted  Abdomen: Soft, gravid, appropriate for gestational age.       Pelvic:  Cervical exam deferred        Extremities: Normal range of motion.     Mental Status: Normal mood and affect. Normal behavior. Normal judgment  and thought content.   Assessment   39 y.o. G3P0010 at [redacted]w[redacted]d by  02/10/2020, by Last Menstrual Period presenting for routine prenatal visit  Plan   THIRD Problems (from 07/05/19 to present)    Problem Noted Resolved   Supervision of high risk pregnancy, antepartum 07/05/2019 by Will Bonnet, MD No   Advanced maternal age in multigravida, first trimester  by Donette Larry No   Overview Addendum 07/05/2019  3:23 PM by Will Bonnet, MD    39 yo      Rh negative status during pregnancy 11/22/2018 by Debera Lat, RN No   Overview Signed 11/22/2018  8:27 AM by Debera Lat, RN    Needs repeat antibody screen & Rhophylac ~28 wks.          Preterm labor symptoms and general obstetric precautions including but not limited to vaginal bleeding, contractions, leaking of fluid and fetal movement were reviewed in detail with the patient. Please refer to After Visit Summary for other counseling recommendations.   -Monitor BP. She is quite anxious today.   Return in about 1 week (around 07/18/2019) for Routine Prenatal Appointment.  Prentice Docker, MD, [facility], [facility] 07/11/2019 1:56 PM

## 2019-07-17 ENCOUNTER — Encounter: Payer: Self-pay | Admitting: Obstetrics and Gynecology

## 2019-07-17 ENCOUNTER — Other Ambulatory Visit: Payer: Self-pay

## 2019-07-17 ENCOUNTER — Ambulatory Visit (INDEPENDENT_AMBULATORY_CARE_PROVIDER_SITE_OTHER): Payer: Medicaid Other | Admitting: Obstetrics and Gynecology

## 2019-07-17 VITALS — BP 135/87 | Wt 161.0 lb

## 2019-07-17 DIAGNOSIS — O26891 Other specified pregnancy related conditions, first trimester: Secondary | ICD-10-CM

## 2019-07-17 DIAGNOSIS — O0991 Supervision of high risk pregnancy, unspecified, first trimester: Secondary | ICD-10-CM

## 2019-07-17 DIAGNOSIS — Z6791 Unspecified blood type, Rh negative: Secondary | ICD-10-CM

## 2019-07-17 DIAGNOSIS — O09521 Supervision of elderly multigravida, first trimester: Secondary | ICD-10-CM

## 2019-07-17 DIAGNOSIS — Z3A10 10 weeks gestation of pregnancy: Secondary | ICD-10-CM

## 2019-07-17 DIAGNOSIS — O0990 Supervision of high risk pregnancy, unspecified, unspecified trimester: Secondary | ICD-10-CM

## 2019-07-17 DIAGNOSIS — Z1379 Encounter for other screening for genetic and chromosomal anomalies: Secondary | ICD-10-CM

## 2019-07-17 LAB — POCT URINALYSIS DIPSTICK OB
Glucose, UA: NEGATIVE
POC,PROTEIN,UA: NEGATIVE

## 2019-07-17 NOTE — Progress Notes (Signed)
ROB

## 2019-07-17 NOTE — Progress Notes (Signed)
  Routine Prenatal Care Visit  Subjective  Erica Atkins is a 39 y.o. G3P0010 at [redacted]w[redacted]d being seen today for ongoing prenatal care.  She is currently monitored for the following issues for this high-risk pregnancy and has Thyroid nodule, uninodular; Chronic pain syndrome; Trapezius muscle spasm; Prenatal care, subsequent pregnancy, first trimester; Attention deficit hyperactivity disorder (ADHD); Depression; Rh negative status during pregnancy; Abnormal Pap smear of cervix; Advanced maternal age in multigravida, first trimester; Missed abortion; and Supervision of high risk pregnancy, antepartum on their problem list.  ----------------------------------------------------------------------------------- Patient reports no complaints.    . Vag. Bleeding: None.   . Leaking Fluid denies.  ----------------------------------------------------------------------------------- The following portions of the patient's history were reviewed and updated as appropriate: allergies, current medications, past family history, past medical history, past social history, past surgical history and problem list. Problem list updated.  Objective  Blood pressure 135/87, weight 161 lb (73 kg), last menstrual period 05/06/2019, unknown if currently breastfeeding. Pregravid weight 153 lb (69.4 kg) Total Weight Gain 8 lb (3.629 kg) Urinalysis: Urine Protein Negative  Urine Glucose Negative  Fetal Status: Fetal Heart Rate (bpm): 160         General:  Alert, oriented and cooperative. Patient is in no acute distress.  Skin: Skin is warm and dry. No rash noted.   Cardiovascular: Normal heart rate noted  Respiratory: Normal respiratory effort, no problems with respiration noted  Abdomen: Soft, gravid, appropriate for gestational age.       Pelvic:  Cervical exam deferred        Extremities: Normal range of motion.     Mental Status: Normal mood and affect. Normal behavior. Normal judgment and thought content.   Assessment    39 y.o. G3P0010 at [redacted]w[redacted]d by  02/10/2020, by Last Menstrual Period presenting for routine prenatal visit  Plan   THIRD Problems (from 07/05/19 to present)    Problem Noted Resolved   Supervision of high risk pregnancy, antepartum 07/05/2019 by Will Bonnet, MD No   Advanced maternal age in multigravida, first trimester  by Donette Larry No   Overview Addendum 07/05/2019  3:23 PM by Will Bonnet, MD    39 yo      Rh negative status during pregnancy 11/22/2018 by Debera Lat, RN No   Overview Signed 11/22/2018  8:27 AM by Debera Lat, RN    Needs repeat antibody screen & Rhophylac ~28 wks.          Preterm labor symptoms and general obstetric precautions including but not limited to vaginal bleeding, contractions, leaking of fluid and fetal movement were reviewed in detail with the patient. Please refer to After Visit Summary for other counseling recommendations.   - NIPT today  Return in about 1 week (around 07/24/2019) for Routine Prenatal Appointment.  Prentice Docker, MD, [facility], [facility] 07/17/2019 4:05 PM

## 2019-07-22 ENCOUNTER — Telehealth: Payer: Self-pay | Admitting: Obstetrics and Gynecology

## 2019-07-22 ENCOUNTER — Telehealth: Payer: Self-pay | Admitting: Licensed Clinical Social Worker

## 2019-07-22 LAB — MATERNIT21 PLUS CORE+SCA
Fetal Fraction: 10
Monosomy X (Turner Syndrome): NOT DETECTED
Trisomy 13 (Patau syndrome): NEGATIVE
Trisomy 18 (Edwards syndrome): NEGATIVE
Trisomy 21 (Down syndrome): NEGATIVE
XXX (Triple X Syndrome): NOT DETECTED
XXY (Klinefelter Syndrome): DETECTED — AB
XYY (Jacobs Syndrome): NOT DETECTED

## 2019-07-22 NOTE — Telephone Encounter (Signed)
LCSW returned patient's call - vm requesting an appt. LCSW scheduled patient for a Zoom appt. - patient is aware that she has to come into the clinic to sign consent prior to the appt.

## 2019-07-22 NOTE — Telephone Encounter (Signed)
Patient is calling to speak with SDJ about her maternity 21 lab results. Patient has questions and concerns. Please advise

## 2019-07-23 NOTE — Telephone Encounter (Signed)
Pt is calling back to get clarification on genetic results. She is aware SDJ is out of the office until Thursday and she even has an appointment with SDJ on Friday. She really wants someone to call her today because she saw an abnormal result.   RPH, pt is aware you are in clinic today and it may be later this afternoon and even after clinic before you are able to call her.

## 2019-07-23 NOTE — Telephone Encounter (Signed)
AMS, please, at your convenience call pt with results. See previous message.

## 2019-07-23 NOTE — Telephone Encounter (Signed)
I just now saw this at 10:30 PM if a provider could call her tomorrow

## 2019-07-24 ENCOUNTER — Other Ambulatory Visit: Payer: Self-pay | Admitting: Obstetrics & Gynecology

## 2019-07-24 NOTE — Progress Notes (Signed)
Discussed results of NIPT w pt by phone today    - Klinefelters Syndrome, XXY Discussed prenatal, postnatal, childhood, adolescence, and adulthood expectations and potential conditions and effects of Klinefelters, in a limited capacity. Recommend genetic counseling to achieve more in-depth discussion. Allayed concerns by patient. Reassured. Consider amnio for confirmation. Pt has appt in 2 days, will discuss further then and at that time schedule [facility]/ Genetic counseling. Not a high risk pregnancy per sey, mostly post natal concerns and monitoring at birth for micropenis, testicles, and later developmental concerns.  Barnett Applebaum, MD, [facility], [facility] 07/24/2019  8:00 AM

## 2019-07-26 ENCOUNTER — Other Ambulatory Visit: Payer: Self-pay

## 2019-07-26 ENCOUNTER — Encounter: Payer: Self-pay | Admitting: Obstetrics and Gynecology

## 2019-07-26 ENCOUNTER — Ambulatory Visit (INDEPENDENT_AMBULATORY_CARE_PROVIDER_SITE_OTHER): Payer: Medicaid Other | Admitting: Obstetrics and Gynecology

## 2019-07-26 VITALS — BP 128/88 | Wt 160.0 lb

## 2019-07-26 DIAGNOSIS — O26891 Other specified pregnancy related conditions, first trimester: Secondary | ICD-10-CM

## 2019-07-26 DIAGNOSIS — O3510X0 Maternal care for (suspected) chromosomal abnormality in fetus, unspecified, not applicable or unspecified: Secondary | ICD-10-CM

## 2019-07-26 DIAGNOSIS — O09521 Supervision of elderly multigravida, first trimester: Secondary | ICD-10-CM

## 2019-07-26 DIAGNOSIS — O351XX0 Maternal care for (suspected) chromosomal abnormality in fetus, not applicable or unspecified: Secondary | ICD-10-CM

## 2019-07-26 DIAGNOSIS — Z3A11 11 weeks gestation of pregnancy: Secondary | ICD-10-CM

## 2019-07-26 DIAGNOSIS — O0990 Supervision of high risk pregnancy, unspecified, unspecified trimester: Secondary | ICD-10-CM

## 2019-07-26 DIAGNOSIS — Z6791 Unspecified blood type, Rh negative: Secondary | ICD-10-CM

## 2019-07-26 NOTE — Progress Notes (Signed)
  Routine Prenatal Care Visit  Subjective  Erica Atkins is a 39 y.o. G3P0010 at [redacted]w[redacted]d being seen today for ongoing prenatal care.  She is currently monitored for the following issues for this high-risk pregnancy and has Thyroid nodule, uninodular; Chronic pain syndrome; Trapezius muscle spasm; Prenatal care, subsequent pregnancy, first trimester; Attention deficit hyperactivity disorder (ADHD); Depression; Rh negative status during pregnancy; Abnormal Pap smear of cervix; Advanced maternal age in multigravida, first trimester; Missed abortion; Supervision of high risk pregnancy, antepartum; and Aneuploidy of fetus affecting management of mother in singleton pregnancy on their problem list.  ----------------------------------------------------------------------------------- Patient reports no complaints.    . Vag. Bleeding: None.   . Leaking Fluid denies.  ----------------------------------------------------------------------------------- The following portions of the patient's history were reviewed and updated as appropriate: allergies, current medications, past family history, past medical history, past social history, past surgical history and problem list. Problem list updated.  Objective  Blood pressure 128/88, weight 160 lb (72.6 kg), last menstrual period 05/06/2019, unknown if currently breastfeeding. Pregravid weight 153 lb (69.4 kg) Total Weight Gain 7 lb (3.175 kg) Urinalysis: Urine Protein    Urine Glucose    Fetal Status: Fetal Heart Rate (bpm): 154         General:  Alert, oriented and cooperative. Patient is in no acute distress.  Skin: Skin is warm and dry. No rash noted.   Cardiovascular: Normal heart rate noted  Respiratory: Normal respiratory effort, no problems with respiration noted  Abdomen: Soft, gravid, appropriate for gestational age.       Pelvic:  Cervical exam deferred        Extremities: Normal range of motion.     Mental Status: Normal mood and affect. Normal  behavior. Normal judgment and thought content.   Assessment   39 y.o. G3P0010 at [redacted]w[redacted]d by  02/10/2020, by Last Menstrual Period presenting for routine prenatal visit  Plan   THIRD Problems (from 07/05/19 to present)    Problem Noted Resolved   Aneuploidy of fetus affecting management of mother in singleton pregnancy 07/26/2019 by Will Bonnet, MD No   Overview Signed 07/26/2019  9:05 AM by Will Bonnet, MD    XXY - sex chromosomes on MaterniT21. [ ]  refer to MFM for consult and genetic counseling      Supervision of high risk pregnancy, antepartum 07/05/2019 by Will Bonnet, MD No   Advanced maternal age in multigravida, first trimester  by Donette Larry No   Overview Addendum 07/05/2019  3:23 PM by Will Bonnet, MD    39 yo      Rh negative status during pregnancy 11/22/2018 by Debera Lat, RN No   Overview Signed 11/22/2018  8:27 AM by Debera Lat, RN    Needs repeat antibody screen & Rhophylac ~28 wks.          Preterm labor symptoms and general obstetric precautions including but not limited to vaginal bleeding, contractions, leaking of fluid and fetal movement were reviewed in detail with the patient. Please refer to After Visit Summary for other counseling recommendations.   - Referral to MFM ([place]) for genetic counseling and consult for management  Return in about 1 week (around 08/02/2019) for Routine Prenatal Appointment.  Prentice Docker, MD, [facility], [facility] 07/26/2019 9:22 AM

## 2019-07-28 NOTE — Addendum Note (Signed)
Addended by: Prentice Docker D on: 07/28/2019 08:16 AM   Modules accepted: Orders

## 2019-08-02 ENCOUNTER — Encounter: Payer: Self-pay | Admitting: Obstetrics and Gynecology

## 2019-08-02 ENCOUNTER — Other Ambulatory Visit: Payer: Self-pay

## 2019-08-02 ENCOUNTER — Ambulatory Visit (INDEPENDENT_AMBULATORY_CARE_PROVIDER_SITE_OTHER): Payer: Medicaid Other | Admitting: Obstetrics and Gynecology

## 2019-08-02 VITALS — BP 122/70 | Wt 163.0 lb

## 2019-08-02 DIAGNOSIS — Z6791 Unspecified blood type, Rh negative: Secondary | ICD-10-CM

## 2019-08-02 DIAGNOSIS — O3510X0 Maternal care for (suspected) chromosomal abnormality in fetus, unspecified, not applicable or unspecified: Secondary | ICD-10-CM

## 2019-08-02 DIAGNOSIS — O09521 Supervision of elderly multigravida, first trimester: Secondary | ICD-10-CM

## 2019-08-02 DIAGNOSIS — O351XX0 Maternal care for (suspected) chromosomal abnormality in fetus, not applicable or unspecified: Secondary | ICD-10-CM

## 2019-08-02 DIAGNOSIS — O0991 Supervision of high risk pregnancy, unspecified, first trimester: Secondary | ICD-10-CM

## 2019-08-02 DIAGNOSIS — O26891 Other specified pregnancy related conditions, first trimester: Secondary | ICD-10-CM

## 2019-08-02 DIAGNOSIS — Z3A12 12 weeks gestation of pregnancy: Secondary | ICD-10-CM

## 2019-08-02 MED ORDER — CITRANATAL HARMONY 27-1-260 MG PO CAPS: 1.0000 | ORAL_CAPSULE | Freq: Every day | ORAL | 3 refills | Status: DC

## 2019-08-02 NOTE — Progress Notes (Signed)
  Routine Prenatal Care Visit  Subjective  Erica Atkins is a 39 y.o. G3P0010 at [redacted]w[redacted]d being seen today for ongoing prenatal care.  She is currently monitored for the following issues for this high-risk pregnancy and has Thyroid nodule, uninodular; Chronic pain syndrome; Trapezius muscle spasm; Prenatal care, subsequent pregnancy, first trimester; Attention deficit hyperactivity disorder (ADHD); Depression; Rh negative status during pregnancy; Abnormal Pap smear of cervix; Advanced maternal age in multigravida, first trimester; Missed abortion; Supervision of high risk pregnancy, antepartum; and Aneuploidy of fetus affecting management of mother in singleton pregnancy on their problem list.  ----------------------------------------------------------------------------------- Patient reports no complaints.    . Vag. Bleeding: None.   . Leaking Fluid denies.  ----------------------------------------------------------------------------------- The following portions of the patient's history were reviewed and updated as appropriate: allergies, current medications, past family history, past medical history, past social history, past surgical history and problem list. Problem list updated.  Objective  Blood pressure 122/70, weight 163 lb (73.9 kg), last menstrual period 05/06/2019, unknown if currently breastfeeding. Pregravid weight 153 lb (69.4 kg) Total Weight Gain 10 lb (4.536 kg) Urinalysis: Urine Protein    Urine Glucose    Fetal Status: Fetal Heart Rate (bpm): 160         General:  Alert, oriented and cooperative. Patient is in no acute distress.  Skin: Skin is warm and dry. No rash noted.   Cardiovascular: Normal heart rate noted  Respiratory: Normal respiratory effort, no problems with respiration noted  Abdomen: Soft, gravid, appropriate for gestational age. Pain/Pressure: Absent     Pelvic:  Cervical exam deferred        Extremities: Normal range of motion.     Mental Status: Normal mood  and affect. Normal behavior. Normal judgment and thought content.   Assessment   39 y.o. G3P0010 at [redacted]w[redacted]d by  02/10/2020, by Last Menstrual Period presenting for routine prenatal visit  Plan   THIRD Problems (from 07/05/19 to present)    Problem Noted Resolved   Aneuploidy of fetus affecting management of mother in singleton pregnancy 07/26/2019 by Will Bonnet, MD No   Overview Signed 07/26/2019  9:05 AM by Will Bonnet, MD    XXY - sex chromosomes on MaterniT21. [ ]  refer to MFM for consult and genetic counseling      Supervision of high risk pregnancy, antepartum 07/05/2019 by Will Bonnet, MD No   Advanced maternal age in multigravida, first trimester  by Donette Larry No   Overview Addendum 07/05/2019  3:23 PM by Will Bonnet, MD    39 yo      Rh negative status during pregnancy 11/22/2018 by Debera Lat, RN No   Overview Signed 11/22/2018  8:27 AM by Debera Lat, RN    Needs repeat antibody screen & Rhophylac ~28 wks.          Preterm labor symptoms and general obstetric precautions including but not limited to vaginal bleeding, contractions, leaking of fluid and fetal movement were reviewed in detail with the patient. Please refer to After Visit Summary for other counseling recommendations.   Return in about 3 weeks (around 08/23/2019) for cancel appt for next week, schedule sometime the week of 5/24 with SDJ.  Prentice Docker, MD, [facility], [facility] 08/02/2019 3:44 PM

## 2019-08-05 ENCOUNTER — Other Ambulatory Visit: Payer: Self-pay | Admitting: Obstetrics and Gynecology

## 2019-08-05 DIAGNOSIS — Z3A13 13 weeks gestation of pregnancy: Secondary | ICD-10-CM

## 2019-08-05 DIAGNOSIS — O3510X1 Maternal care for (suspected) chromosomal abnormality in fetus, unspecified, fetus 1: Secondary | ICD-10-CM

## 2019-08-05 DIAGNOSIS — O09521 Supervision of elderly multigravida, first trimester: Secondary | ICD-10-CM

## 2019-08-05 DIAGNOSIS — O351XX1 Maternal care for (suspected) chromosomal abnormality in fetus, fetus 1: Secondary | ICD-10-CM

## 2019-08-07 ENCOUNTER — Ambulatory Visit: Payer: Self-pay | Admitting: Genetic Counselor

## 2019-08-07 ENCOUNTER — Ambulatory Visit (HOSPITAL_BASED_OUTPATIENT_CLINIC_OR_DEPARTMENT_OTHER): Payer: Medicaid Other | Admitting: Genetic Counselor

## 2019-08-07 ENCOUNTER — Ambulatory Visit: Payer: Medicaid Other | Attending: Obstetrics and Gynecology

## 2019-08-07 ENCOUNTER — Other Ambulatory Visit: Payer: Self-pay

## 2019-08-07 ENCOUNTER — Ambulatory Visit: Payer: Medicaid Other | Admitting: Licensed Clinical Social Worker

## 2019-08-07 ENCOUNTER — Ambulatory Visit: Payer: Medicaid Other | Admitting: *Deleted

## 2019-08-07 DIAGNOSIS — Z368A Encounter for antenatal screening for other genetic defects: Secondary | ICD-10-CM | POA: Diagnosis not present

## 2019-08-07 DIAGNOSIS — Z315 Encounter for genetic counseling: Secondary | ICD-10-CM | POA: Diagnosis not present

## 2019-08-07 DIAGNOSIS — O09521 Supervision of elderly multigravida, first trimester: Secondary | ICD-10-CM

## 2019-08-07 DIAGNOSIS — O0990 Supervision of high risk pregnancy, unspecified, unspecified trimester: Secondary | ICD-10-CM

## 2019-08-07 DIAGNOSIS — O3510X1 Maternal care for (suspected) chromosomal abnormality in fetus, unspecified, fetus 1: Secondary | ICD-10-CM

## 2019-08-07 DIAGNOSIS — Z3A13 13 weeks gestation of pregnancy: Secondary | ICD-10-CM | POA: Diagnosis not present

## 2019-08-07 DIAGNOSIS — O285 Abnormal chromosomal and genetic finding on antenatal screening of mother: Secondary | ICD-10-CM | POA: Diagnosis not present

## 2019-08-07 DIAGNOSIS — O3510X0 Maternal care for (suspected) chromosomal abnormality in fetus, unspecified, not applicable or unspecified: Secondary | ICD-10-CM

## 2019-08-07 DIAGNOSIS — O351XX1 Maternal care for (suspected) chromosomal abnormality in fetus, fetus 1: Secondary | ICD-10-CM

## 2019-08-07 DIAGNOSIS — O351XX0 Maternal care for (suspected) chromosomal abnormality in fetus, not applicable or unspecified: Secondary | ICD-10-CM

## 2019-08-07 DIAGNOSIS — O289 Unspecified abnormal findings on antenatal screening of mother: Secondary | ICD-10-CM | POA: Diagnosis not present

## 2019-08-07 NOTE — Progress Notes (Signed)
08/07/2019  Erica Atkins [birth year] MRN: [id number] DOV: 08/07/2019  Erica Atkins presented to the [facility] for a genetics consultation regarding noninvasive prenatal screening that was high-risk for Klinefelter syndrome. Erica Atkins came to her appointment alone due to COVID-19 visitor restrictions.   Indication for genetic counseling - Noninvasive prenatal screening high-risk for Klinefelter syndrome (47,XXY)  Prenatal history  Erica Atkins is a G73P0020, 39 y.o. female. Her current pregnancy has completed [redacted]w[redacted]d (Estimated Date of Delivery: 02/10/20). Erica Atkins had an elective termination of pregnancy with a prior partner. She also previously had a missed abortion for which she had a D&E procedure at 10 weeks' gestation with her current partner. The embryo from that pregnancy was noted to have a cystic hygroma. Chromosomal microarray performed on products of conception was normal.  Erica Atkins denied exposure to environmental toxins or chemical agents. She denied the use of alcohol, tobacco or street drugs. She reported taking GasX, Flonase, and prenatal vitamins. She denied significant viral illnesses and fevers during the course of her pregnancy. She reported mild spotting at the beginning of her pregnancy. Her medical and surgical histories were otherwise noncontributory.  Family History  A three generation pedigree was drafted and reviewed. The family history is remarkable for the following:  - Erica Atkins reported a history of attention deficit disorder (ADD) in herself and her maternal half sister. We discussed that many times, ADD is multifactorial in nature, occurring due to a combination of genetic, lifestyle, and environmental factors. ADD can appear to run in families; thus, there is a chance that the couple's children could also have ADD. Erica Atkins is encouraged to contact her child's pediatrician if she has any concerns about his development.  The remaining family  histories were reviewed and found to be noncontributory for birth defects, intellectual disability, recurrent pregnancy loss, and known genetic conditions. Erica Atkins had limited information about her partner's extended family history; thus, risk assessment was limited.  The patient's ethnicity is [place]. The father of the pregnancy's ethnicity is [place]. Ashkenazi Jewish ancestry and consanguinity were denied. Pedigree will be scanned under Media.  Discussion  Erica Atkins was referred for genetic counseling as she had MaterniT21 noninvasive prenatal screening (NIPS) that came back high-risk for Klinefelter syndrome (47,XXY) in the current pregnancy.  Klinefelter syndrome:  Klinefelter syndrome is a sex chromosome aneuploidy in which an affected female has two X chromosomes and one Y chromosome instead of the expected one X chromosome and one Y chromosome. Klinefelter syndrome can impact the physical and intellectual development of affected males. Males with Klinefelter syndrome produce decreased amounts of testosterone, leading to delayed or incomplete puberty, breast enlargement (gynecomastia), decreased muscle mass, decreased bone density, and a reduced amount of facial and body hair. Individuals typically have small testes and may have additional differences in their genitalia. They also are often taller than average. Possible impacts on intellectual development include learning disabilities in speech and reading, social/behavioral problems, anxiety, depression, and autism. Other possible complications include an increased risk to develop type 2 diabetes, hypertension, female breast cancer, and autoimmune conditions such as lupus or rheumatoid arthritis. Infertility is present in 95-99% of affected males. Individuals with Klinefelter syndrome may be treated with testosterone replacement therapy at the beginning of puberty and undergo assisted reproductive technologies such as testicular sperm extraction  with intracytoplasmic sperm injection (TESE-ICSI) to increase their chances of fathering biological children.    We discussed that symptoms of Klinefelter syndrome are often mild  and differ among affected individuals. Approximately 75% of males with Klinefelter syndrome are never diagnosed. Many other individuals are not diagnosed until puberty or until experiencing infertility later in adulthood. It has also been suggested that individuals who are diagnosed with Klinefelter syndrome prenatally may be less severely affected than those that are diagnosed postnatally given that this is most often an incidental finding in the prenatal period.   We also reviewed that Klinefelter syndrome is rarely inherited, and instead tends to randomly occur as a result of improper segregation of chromosomal material during the formation of sex cells in a process called nondisjuction. Klinefelter syndrome and other chromosomal aneuploidies are associated with advanced maternal age. This is because nondisjunction occurs more frequently as a female ages.   We reviewed that NIPS analyzes cell free DNA originating from the placenta that is found in the maternal blood circulation during pregnancy. This test can provide information regarding the presence or absence of extra fetal DNA for chromosomes 13, 18 and 21 as well as the sex chromosomes. The reported detection rate is greater than 99% for trisomy 21, greater than 99% for trisomy 18, greater than 91% for trisomy 13, and greater than 94% for monosomy X. However, it cannot be considered diagnostic for chromosome conditions. Positive predictive value (PPV) is the probability that a pregnancy with a positive test result is truly affected. The PPV calculator offered by the [employer] estimated PPV for Klinefelter syndrome in the current pregnancy to be 30%. Thus, there is a 70% chance that this could be a false positive  result.   Erica Atkins was counseled that there are several possible explanations for her high risk NIPS result. Firstly, the fetus could truly be affected by Klinefelter syndrome. Secondly, the fetus could be mosaic for Klinefelter syndrome, though this is rare. Mosaicism occurs when an individual has two or more genetically different sets of cells in their body. Individuals who are mosaic for Klinefelter syndrome have some cells in the body with 47,XXY chromosomes and may have other cells that are chromosomally normal. We discussed that it would not be possible to tell which features an individual with mosaic Klinefelter syndrome may have, as it is impossible to assess which specific cells and tissues in the body have Klinefelter syndrome. Thirdly, the placenta could have Klinefelter syndrome while the fetus could be unaffected. This is a phenomenon known as confined placental mosaicism (CPM). Lastly, this could be a false positive result that resulted due to normal variation.  Ultrasound:   A limited first trimester ultrasound was performed today prior to our visit. The ultrasound report will be sent under separate cover. There were no visualized fetal anomalies or markers suggestive of aneuploidy at this point in pregnancy. The nuchal translucency measurement was normal. We discussed that many fetuses with Klinefelter syndrome would not demonstrate signs of the condition on ultrasound.  Diagnostic testing:  Erica Atkins was also counseled regarding the option of diagnostic testing via chorionic villus sampling (CVS) or amniocentesis . We discussed the technical aspects of each procedure and quoted up to a 1 in 500 (0.2%) risk for spontaneous pregnancy loss or other adverse pregnancy outcomes as a result of either procedure. Cultured cells from either a placental or amniotic fluid sample allow for the visualization of a fetal karyotype, which can detect >99% of chromosomal aberrations. Chromosomal microarray  can also be performed to identify smaller deletions or duplications of fetal chromosomal material.   We  discussed that a limitation to CVS is the rare possibility of confined placental mosaicism. CVS assesses placental tissue which should be representative of the fetus given that they are made up of the same sperm and egg cells; however, in1-2% of cases, confined placental mosaicism may be ofconcern. If mosaicism is suspected based on results from CVS, it would be recommended that follow-up with amniocentesis be performed to assess whether mosaicism is present in the fetus (as amniotic fluid contains cells directly from the fetus) or is confined to the placenta.   After careful consideration, Erica Atkins declined diagnostic testing at this time. She understands that diagnostic testing is available at any point through the end of pregnancy and that she may opt to undergo the procedure at a later date should she change her mind.   Carrier screening:  Per ACOG recommendation, carrier screening for cystic fibrosis (CF), spinal muscular atrophy (SMA), and hemoglobinopathies was discussed including information about the conditions, rationale for testing, autosomal recessive inheritance, and the option of prenatal diagnosis. Without carrier screening to refine risk and based on ethnicity alone, Erica Atkins chance of being a carrier for CF is 1 in 78. Her chance of being a carrier for SMA is 1 in 41. Her chance of being a carrier for HBB-related hemoglobinopathies is 1 in 40. Erica Atkins was possibly interested in pursuing carrier screening, but requested more time to make her decision. She was also informed that CF, SMA, and select hemoglobinopathies are included on [place]'s newborn screen.   Advanced paternal age:   Given that Erica Atkins's partner will be 63 years old at the time of delivery, we also reviewed considerations associated with advanced paternal age. Advanced paternal age (APA) is defined as  greater than 54 years old at time of conception. The chance of de novo (new) single gene mutations in an individual increases with paternal age. We discussed an available screening option for conditions associated with advanced paternal age called Vistara. Vistara utilizes noninvasive prenatal screening (NIPS) to screena pregnancyfor 25 autosomal or X-linked dominant single gene disorders. These include conditions associated with advanced paternal age, such as Noonan syndrome, skeletal dysplasias, and craniosynostoses. Vistara is NOT diagnostic, and a positive result requires confirmation by CVS or amniocentesis. A negative test result does not rule out all single gene disorders. Erica Atkins was potentially interested in pursuing Vistara.  Plan:   Erica Atkins had done thorough research on Klinefelter syndrome prior to her appointment and had several excellent questions today, especially regarding possible treatments and fertility options for individuals with Klinefelter syndrome. She is not concerned about the possibility of having a child with Klinefelter syndrome. However, she did have concerns about the way that such a condition may affect her child's self-esteem. She was especially encouraged to hear about research efforts into assisted reproductive technologies to increase the chance that individuals with Klinefelter syndrome can have their own children.   Erica Atkins. [place] was not interested in pursuing diagnostic testing as she was anxious about the possibility of fetal loss given her history of a prior miscarriage. Instead, she preferred to wait until the postnatal period to pursue chromosomal analysis for her son. She did express potential interested in undergoing Vistara and carrier screening; however, she requested time to discuss these options with her husband first and indicated that she will make a decision by the time she returns for her anatomy ultrasound.  I provided Erica Atkins. [place] with several resources  today, including information on Klinefelter syndrome from [employer] (formerly  Genetics Home Reference) and a pamphlet about Vistara. I counseled Erica Atkins. [place] regarding the above risks and available options. The approximate face-to-face time with the genetic counselor was 45 minutes.  In summary:  Discussed NIPS results and options for follow-up testing  NIPS high-risk for Klinefelter syndrome   Likelihood that this is a true positive result (PPV) is 30%  Declined diagnostic testing  Reviewed results of ultrasound  No fetal anomalies or markers seen  Reduction in risk for fetal aneuploidy  Discussed carrier screening for cystic fibrosis, spinal muscular atrophy, and hemoglobinopathies  Declined carrier screening at this time. Will make a decision by the time of her anatomy ultrasound  Offered additional testing and screening  Offered V[facility]ara single gene NIPS for advanced paternal age. Patient will discuss this with her partner and make a decision by the time of her anatomy ultrasound   Reviewed family history concerns   Buelah Manis, Erica Atkins, Counselling psychologist

## 2019-08-09 ENCOUNTER — Encounter: Payer: Medicaid Other | Admitting: Obstetrics and Gynecology

## 2019-08-21 ENCOUNTER — Telehealth: Payer: Self-pay

## 2019-08-21 NOTE — Telephone Encounter (Signed)
Patient reports to After hours/on call service she has a sore throat that started yesterday & has developed nasal congestion a long with a headache. No definite fever, but feels hot. On call service advised her to take tylenol and do saline rinse (neti  Pot) for nasal congestion and contact PCP w/in 24 hours.

## 2019-08-21 NOTE — Telephone Encounter (Signed)
Patient transferred by front desk to f/u from after hours call. Advised she needs to contact PCP w/her symptoms. We are unable to test for strep and she will need to be cleared of Covid or any other contagious illness prior to coming to her apt on Friday. Advised can take OTC allergy meds (claritin, zyrtec), Tylenol sinus. Can use regular mucinex (guafenesin) or sudafed (behind counter). She will contact PCP and let us know outcome if needs to r/s Friday's apt.

## 2019-08-23 ENCOUNTER — Encounter: Payer: Medicaid Other | Admitting: Obstetrics and Gynecology

## 2019-08-23 ENCOUNTER — Telehealth: Payer: Self-pay | Admitting: Obstetrics and Gynecology

## 2019-08-23 NOTE — Telephone Encounter (Signed)
Patient is calling this morning to canceling

## 2019-08-23 NOTE — Telephone Encounter (Signed)
Spoke w/patient. Advised per Jaclyn Shaggy to take 600mg  q 12hr. Patient sounds much better than she was a few days ago. States she feels better and has been taking the Mucinex. Today she has developed a cough more dry. Advised if she feels she has most of the mucus up/out, may want to switch to Sudafed (behind counter 4 hour) to control nasal congestion as mucinex may be drying her out too much. Aware can use throat losenges/hard candy to help keep throat soothed/cough to minimum. States sore throat is better. Patient appreciative of call.

## 2019-08-23 NOTE — Telephone Encounter (Signed)
Patient inquiring if she can take 2 mucinex (1200mg  total) at a time or if she should only take one (600 mg). Cb#[phone]

## 2019-09-04 ENCOUNTER — Other Ambulatory Visit: Payer: Self-pay

## 2019-09-04 ENCOUNTER — Encounter: Payer: Self-pay | Admitting: Obstetrics and Gynecology

## 2019-09-04 ENCOUNTER — Ambulatory Visit (INDEPENDENT_AMBULATORY_CARE_PROVIDER_SITE_OTHER): Payer: Medicaid Other | Admitting: Obstetrics and Gynecology

## 2019-09-04 VITALS — BP 139/82 | Wt 168.0 lb

## 2019-09-04 DIAGNOSIS — O351XX0 Maternal care for (suspected) chromosomal abnormality in fetus, not applicable or unspecified: Secondary | ICD-10-CM

## 2019-09-04 DIAGNOSIS — O0990 Supervision of high risk pregnancy, unspecified, unspecified trimester: Secondary | ICD-10-CM

## 2019-09-04 DIAGNOSIS — O26892 Other specified pregnancy related conditions, second trimester: Secondary | ICD-10-CM

## 2019-09-04 DIAGNOSIS — O162 Unspecified maternal hypertension, second trimester: Secondary | ICD-10-CM

## 2019-09-04 DIAGNOSIS — O09522 Supervision of elderly multigravida, second trimester: Secondary | ICD-10-CM

## 2019-09-04 DIAGNOSIS — O3510X0 Maternal care for (suspected) chromosomal abnormality in fetus, unspecified, not applicable or unspecified: Secondary | ICD-10-CM

## 2019-09-04 DIAGNOSIS — Z3A17 17 weeks gestation of pregnancy: Secondary | ICD-10-CM

## 2019-09-04 DIAGNOSIS — O161 Unspecified maternal hypertension, first trimester: Secondary | ICD-10-CM | POA: Insufficient documentation

## 2019-09-04 DIAGNOSIS — Z6791 Unspecified blood type, Rh negative: Secondary | ICD-10-CM

## 2019-09-04 LAB — POCT URINALYSIS DIPSTICK OB
Glucose, UA: NEGATIVE
POC,PROTEIN,UA: NEGATIVE

## 2019-09-04 NOTE — Progress Notes (Signed)
Routine Prenatal Care Visit  Subjective  Erica Atkins is a 39 y.o. G3P0020 at [redacted]w[redacted]d being seen today for ongoing prenatal care.  She is currently monitored for the following issues for this high-risk pregnancy and has Thyroid nodule, uninodular; Chronic pain syndrome; Trapezius muscle spasm; Prenatal care, subsequent pregnancy, first trimester; Attention deficit hyperactivity disorder (ADHD); Depression; Rh negative status during pregnancy; Abnormal Pap smear of cervix; Advanced maternal age in multigravida; Missed abortion; Supervision of high risk pregnancy, antepartum; Aneuploidy of fetus affecting management of mother in singleton pregnancy; and Elevated blood pressure affecting pregnancy in first trimester, antepartum on their problem list.  ----------------------------------------------------------------------------------- Patient reports no complaints.    . Vag. Bleeding: None.  Movement: Absent. Leaking Fluid denies.  ----------------------------------------------------------------------------------- The following portions of the patient's history were reviewed and updated as appropriate: allergies, current medications, past family history, past medical history, past social history, past surgical history and problem list. Problem list updated.  Objective  Blood pressure 139/82, weight 168 lb (76.2 kg), last menstrual period 05/06/2019, unknown if currently breastfeeding. Pregravid weight 153 lb (69.4 kg) Total Weight Gain 15 lb (6.804 kg) Urinalysis: Urine Protein Negative  Urine Glucose Negative  Fetal Status: Fetal Heart Rate (bpm): 140   Movement: Absent     General:  Alert, oriented and cooperative. Patient is in no acute distress.  Skin: Skin is warm and dry. No rash noted.   Cardiovascular: Normal heart rate noted  Respiratory: Normal respiratory effort, no problems with respiration noted  Abdomen: Soft, gravid, appropriate for gestational age. Pain/Pressure: Absent     Pelvic:   Cervical exam deferred        Extremities: Normal range of motion.     Mental Status: Normal mood and affect. Normal behavior. Normal judgment and thought content.   Assessment   39 y.o. G3P0020 at [redacted]w[redacted]d by  02/10/2020, by Last Menstrual Period presenting for routine prenatal visit  Plan   THIRD Problems (from 07/05/19 to present)    Problem Noted Resolved   Elevated blood pressure affecting pregnancy in first trimester, antepartum 09/04/2019 by Will Bonnet, MD No   Overview Signed 09/04/2019  8:51 AM by Will Bonnet, MD    No diagnosis of hypertension.   Baseline labs [ ]       Aneuploidy of fetus affecting management of mother in singleton pregnancy 07/26/2019 by Will Bonnet, MD No   Overview Addendum 09/04/2019  9:08 AM by Will Bonnet, MD    XXY - sex chromosomes on MaterniT21. [x]  refer to MFM for consult and genetic counseling      Supervision of high risk pregnancy, antepartum 07/05/2019 by Will Bonnet, MD No   Advanced maternal age in multigravida  by Donette Larry No   Overview Addendum 07/05/2019  3:23 PM by Will Bonnet, MD    39 yo      Rh negative status during pregnancy 11/22/2018 by Debera Lat, RN No   Overview Signed 11/22/2018  8:27 AM by Debera Lat, RN    Needs repeat antibody screen & Rhophylac ~28 wks.          Preterm labor symptoms and general obstetric precautions including but not limited to vaginal bleeding, contractions, leaking of fluid and fetal movement were reviewed in detail with the patient. Please refer to After Visit Summary for other counseling recommendations.   Return in about 2 weeks (around 09/18/2019) for Anatomy u /s and routine prenatal.  Prentice Docker, MD, [place], [facility] 09/04/2019 9:08 AM

## 2019-09-04 NOTE — Progress Notes (Signed)
ROB Discuss prenatal vitamin

## 2019-09-05 LAB — COMPREHENSIVE METABOLIC PANEL
ALT: 16 IU/L (ref 0–32)
AST: 21 IU/L (ref 0–40)
Albumin/Globulin Ratio: 1.8 (ref 1.2–2.2)
Albumin: 4.2 g/dL (ref 3.8–4.8)
Alkaline Phosphatase: 66 IU/L (ref 48–121)
BUN/Creatinine Ratio: 16 (ref 9–23)
BUN: 8 mg/dL (ref 6–20)
Bilirubin Total: 0.4 mg/dL (ref 0.0–1.2)
CO2: 23 mmol/L (ref 20–29)
Calcium: 9.2 mg/dL (ref 8.7–10.2)
Chloride: 101 mmol/L (ref 96–106)
Creatinine, Ser: 0.5 mg/dL — ABNORMAL LOW (ref 0.57–1.00)
GFR calc Af Amer: 141 mL/min/{1.73_m2} (ref >59)
GFR calc non Af Amer: 122 mL/min/{1.73_m2} (ref >59)
Globulin, Total: 2.3 g/dL (ref 1.5–4.5)
Glucose: 71 mg/dL (ref 65–99)
Potassium: 4.5 mmol/L (ref 3.5–5.2)
Sodium: 137 mmol/L (ref 134–144)
Total Protein: 6.5 g/dL (ref 6.0–8.5)

## 2019-09-05 LAB — PROTEIN / CREATININE RATIO, URINE
Creatinine, Urine: 62.6 mg/dL
Protein, Ur: 6.2 mg/dL
Protein/Creat Ratio: 99 mg/g creat (ref 0–200)

## 2019-09-25 ENCOUNTER — Encounter: Payer: Self-pay | Admitting: Obstetrics and Gynecology

## 2019-09-25 ENCOUNTER — Other Ambulatory Visit: Payer: Self-pay

## 2019-09-25 ENCOUNTER — Other Ambulatory Visit: Payer: Self-pay | Admitting: Obstetrics and Gynecology

## 2019-09-25 ENCOUNTER — Ambulatory Visit (INDEPENDENT_AMBULATORY_CARE_PROVIDER_SITE_OTHER): Payer: Medicaid Other | Admitting: Obstetrics and Gynecology

## 2019-09-25 ENCOUNTER — Ambulatory Visit (INDEPENDENT_AMBULATORY_CARE_PROVIDER_SITE_OTHER): Payer: Medicaid Other

## 2019-09-25 VITALS — BP 124/80 | Wt 172.0 lb

## 2019-09-25 DIAGNOSIS — O0990 Supervision of high risk pregnancy, unspecified, unspecified trimester: Secondary | ICD-10-CM | POA: Diagnosis not present

## 2019-09-25 DIAGNOSIS — O161 Unspecified maternal hypertension, first trimester: Secondary | ICD-10-CM

## 2019-09-25 DIAGNOSIS — Z3A20 20 weeks gestation of pregnancy: Secondary | ICD-10-CM

## 2019-09-25 DIAGNOSIS — O09522 Supervision of elderly multigravida, second trimester: Secondary | ICD-10-CM

## 2019-09-25 DIAGNOSIS — Z3686 Encounter for antenatal screening for cervical length: Secondary | ICD-10-CM | POA: Diagnosis not present

## 2019-09-25 DIAGNOSIS — O26892 Other specified pregnancy related conditions, second trimester: Secondary | ICD-10-CM

## 2019-09-25 DIAGNOSIS — Z6791 Unspecified blood type, Rh negative: Secondary | ICD-10-CM

## 2019-09-25 NOTE — Progress Notes (Signed)
Routine Prenatal Care Visit  Subjective  Erica Atkins is a 39 y.o. G3P0020 at [redacted]w[redacted]d being seen today for ongoing prenatal care.  She is currently monitored for the following issues for this high-risk pregnancy and has Thyroid nodule, uninodular; Chronic pain syndrome; Trapezius muscle spasm; Prenatal care, subsequent pregnancy, first trimester; Attention deficit hyperactivity disorder (ADHD); Depression; Rh negative status during pregnancy; Abnormal Pap smear of cervix; Advanced maternal age in multigravida; Missed abortion; Supervision of high risk pregnancy, antepartum; Aneuploidy of fetus affecting management of mother in singleton pregnancy; and Elevated blood pressure affecting pregnancy in first trimester, antepartum on their problem list.  ----------------------------------------------------------------------------------- Patient reports no complaints.   Contractions: Not present. Vag. Bleeding: None.  Movement: Absent. Leaking Fluid denies.  Anatomy u/s today incomplete for cardiac outflow views.  ----------------------------------------------------------------------------------- The following portions of the patient's history were reviewed and updated as appropriate: allergies, current medications, past family history, past medical history, past social history, past surgical history and problem list. Problem list updated.  Objective  Blood pressure 124/80, weight 172 lb (78 kg), last menstrual period 05/06/2019, unknown if currently breastfeeding. Pregravid weight 153 lb (69.4 kg) Total Weight Gain 19 lb (8.618 kg) Urinalysis: Urine Protein    Urine Glucose    Fetal Status: Fetal Heart Rate (bpm): 144   Movement: Absent     General:  Alert, oriented and cooperative. Patient is in no acute distress.  Skin: Skin is warm and dry. No rash noted.   Cardiovascular: Normal heart rate noted  Respiratory: Normal respiratory effort, no problems with respiration noted  Abdomen: Soft, gravid,  appropriate for gestational age. Pain/Pressure: Absent     Pelvic:  Cervical exam deferred        Extremities: Normal range of motion.     Mental Status: Normal mood and affect. Normal behavior. Normal judgment and thought content.   Imaging Results US OB Comp + 14 Wk  Result Date: 09/25/2019 Patient Name: Erica Atkins DOB: 09/29/1980 MRN: [id number] ULTRASOUND REPORT Location: [facility] Date of Service: 09/25/2019 Indications:Anatomy Ultrasound Findings: Nelda Marseille intrauterine pregnancy is visualized with FHR at 141 BPM. Biometrics give an (U/S) Gestational age of [redacted]w[redacted]d and an (U/S) EDD of 02/07/2020; this correlates with the clinically established Estimated Date of Delivery: 02/10/20 Fetal presentation is Breech. EFW: 391 g ( 14 oz ). Placenta: anterior. Grade: 1 AFI: subjectively normal. Anatomic survey is incomplete for RVOT, LVOT and normal; Gender - female.  Impression: 1. [redacted]w[redacted]d Viable Singleton Intrauterine pregnancy by U/S. 2. (U/S) EDD is consistent with Clinically established Estimated Date of Delivery: 02/10/20 . 3. Normal Anatomy Scan, suboptimal cardiac outflow views. Recommendations: 1.Clinical correlation with the patient's History and Physical Exam. 2. Repeat cardiac outflow views in 2-4 weeks. Gweneth Dimitri, RT There is a singleton gestation with subjectively normal amniotic fluid volume. The fetal biometry correlates with established dating. Detailed evaluation of the fetal anatomy was performed.The fetal anatomical survey appears within normal limits within the resolution of ultrasound as described above.  Not all structures were able to be visualized on today's study.  It is recommended that a follow-up ultrasound be performed to complete visualization of the unobserved structures today.  It must be noted that a normal ultrasound is unable to rule out fetal aneuploidy nor is it able to detect all possible malformations.   The ultrasound images and findings were reviewed by me and I  agree with the above report. Prentice Docker, MD, [facility], [facility] 09/25/2019 9:28 AM  US OB Transvaginal  Result Date: 09/25/2019 Patient Name: Erica Atkins DOB: May 08, 1980 MRN: [id number] ULTRASOUND REPORT Location: [facility] Date of Service: 09/25/2019 Indications:Anatomy Ultrasound Findings: Nelda Marse[place] intrauterine pregnancy is visualized with FHR at 141 BPM.  Biometrics give an (U/S) Gestational age of [redacted]w[redacted]d and an (U/S) EDD of 02/07/2020; this correlates with the clinically established Estimated Date of Delivery: 02/10/20  Fetal presentation is Breech. EFW: 391 g ( 14 oz ). Placenta: anterior. Grade: 1 AFI: subjectively normal.  Anatomic survey is incomplete for RVOT, LVOT and normal; Gender - female.   Impression: 1. [redacted]w[redacted]d Viable Singleton Intrauterine pregnancy by U/S. 2. (U/S) EDD is consistent with Clinically established Estimated Date of Delivery: 02/10/20 . 3. Normal Anatomy Scan, suboptimal cardiac outflow views.  Recommendations: 1.Clinical correlation with the patient's History and Physical Exam. 2. Repeat cardiac outflow views in 2-4 weeks.  Gweneth Dimitri, RT  There is a singleton gestation with subjectively normal amniotic fluid volume. The fetal biometry correlates with established dating. Detailed evaluation of the fetal anatomy was performed.The fetal anatomical survey appears within normal limits within the resolution of ultrasound as described above.  Not all structures were able to be visualized on today's study.  It is recommended that a follow-up ultrasound be performed to complete visualization of the unobserved structures today.  It must be noted that a normal ultrasound is unable to rule out fetal aneuploidy nor is it able to detect all possible malformations.    The ultrasound images and findings were reviewed by me and I agree with the above report.  Prentice Docker, MD, [facility], [facility] 09/25/2019 9:28 AM        Assessment   39 y.o. G3P0020 at [redacted]w[redacted]d by  02/10/2020, by Last Menstrual Period presenting for routine prenatal visit  Plan   THIRD Problems (from 07/05/19 to present)    Problem Noted Resolved   Elevated blood pressure affecting pregnancy in first trimester, antepartum 09/04/2019 by Will Bonnet, MD No   Overview Signed 09/04/2019  8:51 AM by Will Bonnet, MD    No diagnosis of hypertension.   Baseline labs [ ]       Aneuploidy of fetus affecting management of mother in singleton pregnancy 07/26/2019 by Will Bonnet, MD No   Overview Addendum 09/04/2019  9:08 AM by Will Bonnet, MD    XXY - sex chromosomes on MaterniT21. [x]  refer to MFM for consult and genetic counseling      Previous Version   Supervision of high risk pregnancy, antepartum 07/05/2019 by Will Bonnet, MD No   Advanced maternal age in multigravida  by Donette Larry No   Overview Addendum 07/05/2019  3:23 PM by Will Bonnet, MD    39 yo      Previous Version   Rh negative status during pregnancy 11/22/2018 by Debera Lat, RN No   Overview Signed 11/22/2018  8:27 AM by Debera Lat, RN    Needs repeat antibody screen & Rhophylac ~28 wks.          Preterm labor symptoms and general obstetric precautions including but not limited to vaginal bleeding, contractions, leaking of fluid and fetal movement were reviewed in detail with the patient. Please refer to After Visit Summary for other counseling recommendations.   Return in about 2 weeks (around 10/09/2019) for 2-4 weeks, u/s for completion anatomy and ROB with SDJ.  Prentice Docker, MD, [facility], [facility] 09/25/2019 10:09 AM

## 2019-10-09 ENCOUNTER — Telehealth: Payer: Self-pay

## 2019-10-09 NOTE — Telephone Encounter (Signed)
Pt calling; 22w; how long can you be sexually active?  Is there a cutoff?  [phone]  busy

## 2019-10-09 NOTE — Telephone Encounter (Signed)
Adv pt there is no cut off as long as she isn't bleeding or having ctxs.  Explained diff between true ctxs and B-H; how to tell diff between bloating and ctxs; adv to take colace, drink 64oz water a day, eat fresh fruits and vegetables to help c bloating - may needs to eat smaller meals or graze all day instead of 63meals a day.

## 2019-10-14 ENCOUNTER — Ambulatory Visit: Payer: Medicaid Other

## 2019-10-14 ENCOUNTER — Encounter: Payer: Medicaid Other | Admitting: Obstetrics and Gynecology

## 2019-10-14 ENCOUNTER — Other Ambulatory Visit: Payer: Self-pay

## 2019-10-25 ENCOUNTER — Ambulatory Visit (INDEPENDENT_AMBULATORY_CARE_PROVIDER_SITE_OTHER): Payer: Medicaid Other

## 2019-10-25 ENCOUNTER — Ambulatory Visit (INDEPENDENT_AMBULATORY_CARE_PROVIDER_SITE_OTHER): Payer: Medicaid Other | Admitting: Obstetrics

## 2019-10-25 ENCOUNTER — Other Ambulatory Visit: Payer: Self-pay

## 2019-10-25 VITALS — BP 140/90 | Wt 182.0 lb

## 2019-10-25 DIAGNOSIS — O0992 Supervision of high risk pregnancy, unspecified, second trimester: Secondary | ICD-10-CM | POA: Diagnosis not present

## 2019-10-25 DIAGNOSIS — N898 Other specified noninflammatory disorders of vagina: Secondary | ICD-10-CM

## 2019-10-25 DIAGNOSIS — O0990 Supervision of high risk pregnancy, unspecified, unspecified trimester: Secondary | ICD-10-CM

## 2019-10-25 DIAGNOSIS — Z3A24 24 weeks gestation of pregnancy: Secondary | ICD-10-CM

## 2019-10-25 LAB — POCT WET PREP (WET MOUNT)
Clue Cells Wet Prep Whiff POC: NEGATIVE
Trichomonas Wet Prep HPF POC: ABSENT

## 2019-10-25 LAB — POCT URINALYSIS DIPSTICK OB
Bilirubin, UA: NEGATIVE
Blood, UA: NEGATIVE
Glucose, UA: NEGATIVE
Ketones, UA: NEGATIVE
Leukocytes, UA: NEGATIVE
Nitrite, UA: NEGATIVE
POC,PROTEIN,UA: NEGATIVE
Spec Grav, UA: 1.025 (ref 1.010–1.025)
Urobilinogen, UA: 0.2 E.U./dL
pH, UA: 5 (ref 5.0–8.0)

## 2019-10-25 NOTE — Progress Notes (Signed)
Routine Prenatal Care Visit  Subjective  Erica Atkins is a 39 y.o. G3P0020 at [redacted]w[redacted]d being seen today for ongoing prenatal care.  She is currently monitored for the following issues for this high-risk pregnancy and has Thyroid nodule, uninodular; Chronic pain syndrome; Trapezius muscle spasm; Prenatal care, subsequent pregnancy, first trimester; Attention deficit hyperactivity disorder (ADHD); Depression; Rh negative status during pregnancy; Abnormal Pap smear of cervix; Advanced maternal age in multigravida; Missed abortion; Supervision of high risk pregnancy, antepartum; Aneuploidy of fetus affecting management of mother in singleton pregnancy; and Elevated blood pressure affecting pregnancy in first trimester, antepartum on their problem list.  ----------------------------------------------------------------------------------- Patient reports no bleeding, no contractions, no cramping, vaginal irritation and and that she has had arepeat anatomy scan today. She has had some vaginal and labial irritation today. Struggles some with urinary incontinance, esp during the pregnancy. requests a wet mount.    .  .   Jacklyn Shell Fluid denies.  ----------------------------------------------------------------------------------- The following portions of the patient's history were reviewed and updated as appropriate: allergies, current medications, past family history, past medical history, past social history, past surgical history and problem list. Problem list updated.  Objective  Blood pressure (!) 140/90, weight 182 lb (82.6 kg), last menstrual period 05/06/2019, unknown if currently breastfeeding. Pregravid weight 153 lb (69.4 kg) Total Weight Gain 29 lb (13.2 kg) Urinalysis: Urine Protein Negative  Urine Glucose Negative  Fetal Status:         repeat anatomy today shows normal heart structure and chambers. Saline wet mount - neg ofr clues, whiff, yeast buds or hyphae. Few bacteria seen.  General:   Alert, oriented and cooperative. Patient is in no acute distress.  Skin: Skin is warm and dry. No rash noted.   Cardiovascular: Normal heart rate noted  Respiratory: Normal respiratory effort, no problems with respiration noted  Abdomen: Soft, gravid, appropriate for gestational age.       Pelvic:  Cervical exam deferred        Extremities: Normal range of motion.     Mental Status: Normal mood and affect. Normal behavior. Normal judgment and thought content.  Saline wet mount is benighn with normal findings. Assessment   39 y.o. G3P0020 at [redacted]w[redacted]d by  02/10/2020, by Last Menstrual Period presenting for routine prenatal visit  Plan   THIRD Problems (from 07/05/19 to present)    Problem Noted Resolved   Elevated blood pressure affecting pregnancy in first trimester, antepartum 09/04/2019 by Will Bonnet, MD No   Overview Signed 09/04/2019  8:51 AM by Will Bonnet, MD    No diagnosis of hypertension.   Baseline labs [ ]       Aneuploidy of fetus affecting management of mother in singleton pregnancy 07/26/2019 by Will Bonnet, MD No   Overview Addendum 09/04/2019  9:08 AM by Will Bonnet, MD    XXY - sex chromosomes on MaterniT21. [x]  refer to MFM for consult and genetic counseling      Previous Version   Supervision of high risk pregnancy, antepartum 07/05/2019 by Will Bonnet, MD No   Advanced maternal age in multigravida  by Donette Larry No   Overview Addendum 07/05/2019  3:23 PM by Will Bonnet, MD    39 yo      Previous Version   Rh negative status during pregnancy 11/22/2018 by Debera Lat, RN No   Overview Signed 11/22/2018  8:27 AM by Debera Lat, RN    Needs repeat antibody screen & Rhophylac ~28 wks.  Preterm labor symptoms and general obstetric precautions including but not limited to vaginal bleeding, contractions, leaking of fluid and fetal movement were reviewed in detail with the patient. Please refer to After Visit  Summary for other counseling recommendations.   No follow-ups on file.  Imagene Riches, CNM  10/25/2019 2:59 PM

## 2019-10-25 NOTE — Progress Notes (Signed)
ROB/Anatomy- burning urinating, excessive discharge and abnormal odor

## 2019-11-03 ENCOUNTER — Encounter: Payer: Self-pay | Admitting: Obstetrics and Gynecology

## 2019-11-03 ENCOUNTER — Inpatient Hospital Stay
Admission: EM | Admit: 2019-11-03 | Discharge: 2019-11-05 | DRG: 833 | Disposition: A | Discharge status: Other Acute Inpatient Hospital | Payer: Medicaid Other | Attending: Obstetrics & Gynecology | Admitting: Obstetrics & Gynecology

## 2019-11-03 DIAGNOSIS — O119 Pre-existing hypertension with pre-eclampsia, unspecified trimester: Secondary | ICD-10-CM

## 2019-11-03 DIAGNOSIS — Z3A26 26 weeks gestation of pregnancy: Secondary | ICD-10-CM

## 2019-11-03 DIAGNOSIS — O3510X0 Maternal care for (suspected) chromosomal abnormality in fetus, unspecified, not applicable or unspecified: Secondary | ICD-10-CM

## 2019-11-03 DIAGNOSIS — O09523 Supervision of elderly multigravida, third trimester: Secondary | ICD-10-CM

## 2019-11-03 DIAGNOSIS — R519 Headache, unspecified: Secondary | ICD-10-CM

## 2019-11-03 DIAGNOSIS — Z20822 Contact with and (suspected) exposure to covid-19: Secondary | ICD-10-CM | POA: Diagnosis present

## 2019-11-03 DIAGNOSIS — O10919 Unspecified pre-existing hypertension complicating pregnancy, unspecified trimester: Secondary | ICD-10-CM

## 2019-11-03 DIAGNOSIS — O10012 Pre-existing essential hypertension complicating pregnancy, second trimester: Principal | ICD-10-CM | POA: Diagnosis present

## 2019-11-03 DIAGNOSIS — O351XX0 Maternal care for (suspected) chromosomal abnormality in fetus, not applicable or unspecified: Secondary | ICD-10-CM

## 2019-11-03 DIAGNOSIS — O161 Unspecified maternal hypertension, first trimester: Secondary | ICD-10-CM

## 2019-11-03 DIAGNOSIS — O163 Unspecified maternal hypertension, third trimester: Secondary | ICD-10-CM | POA: Diagnosis present

## 2019-11-03 DIAGNOSIS — O0990 Supervision of high risk pregnancy, unspecified, unspecified trimester: Secondary | ICD-10-CM

## 2019-11-03 DIAGNOSIS — O26892 Other specified pregnancy related conditions, second trimester: Secondary | ICD-10-CM

## 2019-11-03 LAB — COMPREHENSIVE METABOLIC PANEL
ALT: 24 U/L (ref 0–44)
AST: 27 U/L (ref 15–41)
Albumin: 3.2 g/dL — ABNORMAL LOW (ref 3.5–5.0)
Alkaline Phosphatase: 75 U/L (ref 38–126)
Anion gap: 9 (ref 5–15)
BUN: 11 mg/dL (ref 6–20)
CO2: 21 mmol/L — ABNORMAL LOW (ref 22–32)
Calcium: 8.6 mg/dL — ABNORMAL LOW (ref 8.9–10.3)
Chloride: 107 mmol/L (ref 98–111)
Creatinine, Ser: 0.34 mg/dL — ABNORMAL LOW (ref 0.44–1.00)
GFR calc Af Amer: >60 mL/min (ref >60)
GFR calc non Af Amer: >60 mL/min (ref >60)
Glucose, Bld: 81 mg/dL (ref 70–99)
Potassium: 3.6 mmol/L (ref 3.5–5.1)
Sodium: 137 mmol/L (ref 135–145)
Total Bilirubin: 0.6 mg/dL (ref 0.3–1.2)
Total Protein: 6.2 g/dL — ABNORMAL LOW (ref 6.5–8.1)

## 2019-11-03 LAB — CBC
HCT: 34.1 % — ABNORMAL LOW (ref 36.0–46.0)
Hemoglobin: 12.5 g/dL (ref 12.0–15.0)
MCH: 31.6 pg (ref 26.0–34.0)
MCHC: 36.7 g/dL — ABNORMAL HIGH (ref 30.0–36.0)
MCV: 86.1 fL (ref 80.0–100.0)
Platelets: 208 10*3/uL (ref 150–400)
RBC: 3.96 MIL/uL (ref 3.87–5.11)
RDW: 12.3 % (ref 11.5–15.5)
WBC: 12.2 10*3/uL — ABNORMAL HIGH (ref 4.0–10.5)
nRBC: 0 % (ref 0.0–0.2)

## 2019-11-03 LAB — PROTEIN / CREATININE RATIO, URINE
Creatinine, Urine: <10 mg/dL
Total Protein, Urine: <6 mg/dL

## 2019-11-03 MED ORDER — LACTATED RINGERS IV SOLN: INTRAVENOUS | Status: DC

## 2019-11-03 MED ORDER — LACTATED RINGERS IV SOLN: 500.0000 mL | INTRAVENOUS | Status: DC | PRN

## 2019-11-03 MED ORDER — LABETALOL HCL 5 MG/ML IV SOLN
20.0000 mg | INTRAVENOUS | Status: DC | PRN
  Administered 2019-11-03 – 2019-11-05 (×3): 20 mg via INTRAVENOUS
  Filled 2019-11-03 (×5): qty 4

## 2019-11-03 MED ORDER — LABETALOL HCL 5 MG/ML IV SOLN: 80.0000 mg | INTRAVENOUS | Status: DC | PRN

## 2019-11-03 MED ORDER — HYDRALAZINE HCL 20 MG/ML IJ SOLN: 10.0000 mg | INTRAMUSCULAR | Status: DC | PRN

## 2019-11-03 MED ORDER — LABETALOL HCL 5 MG/ML IV SOLN
40.0000 mg | INTRAVENOUS | Status: DC | PRN
  Administered 2019-11-05: 40 mg via INTRAVENOUS
  Filled 2019-11-03 (×2): qty 8

## 2019-11-03 NOTE — OB Triage Note (Signed)
Pt presents to unit c/o a headache that began a couple of days ago but has worsened tonight. Pt states the right side hurts more and there is some pressure on her right eye. Pt states she has had a history of some elevated pressures in the past but has not taken any medications. Pt reports +FM, denies ctx, LOF, and vaginal bleeding.

## 2019-11-04 ENCOUNTER — Other Ambulatory Visit: Payer: Self-pay

## 2019-11-04 ENCOUNTER — Observation Stay (HOSPITAL_BASED_OUTPATIENT_CLINIC_OR_DEPARTMENT_OTHER): Payer: Medicaid Other | Admitting: Obstetrics and Gynecology

## 2019-11-04 DIAGNOSIS — O1412 Severe pre-eclampsia, second trimester: Secondary | ICD-10-CM | POA: Diagnosis not present

## 2019-11-04 DIAGNOSIS — Z3A26 26 weeks gestation of pregnancy: Secondary | ICD-10-CM | POA: Diagnosis not present

## 2019-11-04 DIAGNOSIS — Z20822 Contact with and (suspected) exposure to covid-19: Secondary | ICD-10-CM | POA: Diagnosis present

## 2019-11-04 DIAGNOSIS — O10912 Unspecified pre-existing hypertension complicating pregnancy, second trimester: Secondary | ICD-10-CM | POA: Diagnosis not present

## 2019-11-04 DIAGNOSIS — O351XX0 Maternal care for (suspected) chromosomal abnormality in fetus, not applicable or unspecified: Secondary | ICD-10-CM | POA: Diagnosis not present

## 2019-11-04 DIAGNOSIS — O10012 Pre-existing essential hypertension complicating pregnancy, second trimester: Secondary | ICD-10-CM | POA: Diagnosis present

## 2019-11-04 DIAGNOSIS — O09522 Supervision of elderly multigravida, second trimester: Secondary | ICD-10-CM

## 2019-11-04 DIAGNOSIS — R519 Headache, unspecified: Secondary | ICD-10-CM | POA: Diagnosis not present

## 2019-11-04 DIAGNOSIS — O09523 Supervision of elderly multigravida, third trimester: Secondary | ICD-10-CM | POA: Diagnosis not present

## 2019-11-04 DIAGNOSIS — O10919 Unspecified pre-existing hypertension complicating pregnancy, unspecified trimester: Secondary | ICD-10-CM | POA: Diagnosis present

## 2019-11-04 DIAGNOSIS — O26892 Other specified pregnancy related conditions, second trimester: Secondary | ICD-10-CM | POA: Diagnosis not present

## 2019-11-04 DIAGNOSIS — O162 Unspecified maternal hypertension, second trimester: Secondary | ICD-10-CM | POA: Diagnosis not present

## 2019-11-04 LAB — SAMPLE TO BLOOD BANK

## 2019-11-04 LAB — SARS CORONAVIRUS 2 BY RT PCR (HOSPITAL ORDER, PERFORMED IN ~~LOC~~ HOSPITAL LAB): SARS Coronavirus 2: NEGATIVE

## 2019-11-04 MED ORDER — ACETAMINOPHEN 500 MG PO TABS
1000.0000 mg | ORAL_TABLET | Freq: Four times a day (QID) | ORAL | Status: DC | PRN
  Administered 2019-11-04 (×3): 1000 mg via ORAL
  Filled 2019-11-04 (×2): qty 2

## 2019-11-04 MED ORDER — NIFEDIPINE ER OSMOTIC RELEASE 30 MG PO TB24: 60.0000 mg | ORAL_TABLET | Freq: Two times a day (BID) | ORAL | Status: DC

## 2019-11-04 MED ORDER — DOCUSATE SODIUM 100 MG PO CAPS
100.0000 mg | ORAL_CAPSULE | Freq: Every day | ORAL | Status: DC
  Administered 2019-11-04 – 2019-11-05 (×2): 100 mg via ORAL
  Filled 2019-11-04 (×2): qty 1

## 2019-11-04 MED ORDER — ACETAMINOPHEN 500 MG PO TABS
ORAL_TABLET | ORAL | Status: AC
  Filled 2019-11-04: qty 2

## 2019-11-04 MED ORDER — ZOLPIDEM TARTRATE 5 MG PO TABS: 5.0000 mg | ORAL_TABLET | Freq: Every evening | ORAL | Status: DC | PRN

## 2019-11-04 MED ORDER — PROCHLORPERAZINE EDISYLATE 10 MG/2ML IJ SOLN
10.0000 mg | Freq: Once | INTRAMUSCULAR | Status: AC
  Administered 2019-11-04: 10 mg via INTRAVENOUS
  Filled 2019-11-04: qty 2

## 2019-11-04 MED ORDER — CALCIUM CARBONATE ANTACID 500 MG PO CHEW: 2.0000 | CHEWABLE_TABLET | ORAL | Status: DC | PRN

## 2019-11-04 MED ORDER — BUTALBITAL-APAP-CAFFEINE 50-325-40 MG PO TABS
1.0000 | ORAL_TABLET | Freq: Four times a day (QID) | ORAL | Status: DC | PRN
  Administered 2019-11-04: 1 via ORAL
  Administered 2019-11-04 – 2019-11-05 (×2): 2 via ORAL
  Filled 2019-11-04: qty 2
  Filled 2019-11-04: qty 1
  Filled 2019-11-04 (×5): qty 2

## 2019-11-04 MED ORDER — DIPHENHYDRAMINE HCL 50 MG/ML IJ SOLN
25.0000 mg | Freq: Once | INTRAMUSCULAR | Status: AC
  Administered 2019-11-04: 25 mg via INTRAVENOUS
  Filled 2019-11-04: qty 1

## 2019-11-04 MED ORDER — NIFEDIPINE ER OSMOTIC RELEASE 30 MG PO TB24
ORAL_TABLET | ORAL | Status: AC
  Filled 2019-11-04: qty 2

## 2019-11-04 MED ORDER — PRENATAL MULTIVITAMIN CH
1.0000 | ORAL_TABLET | Freq: Every day | ORAL | Status: DC
  Administered 2019-11-04 – 2019-11-05 (×2): 1 via ORAL
  Filled 2019-11-04 (×2): qty 1

## 2019-11-04 MED ORDER — NIFEDIPINE ER OSMOTIC RELEASE 30 MG PO TB24
60.0000 mg | ORAL_TABLET | Freq: Every day | ORAL | Status: DC
  Administered 2019-11-04 – 2019-11-05 (×3): 60 mg via ORAL
  Filled 2019-11-04 (×2): qty 2

## 2019-11-04 MED ORDER — LABETALOL HCL 100 MG PO TABS
200.0000 mg | ORAL_TABLET | Freq: Three times a day (TID) | ORAL | Status: DC
  Administered 2019-11-04 – 2019-11-05 (×3): 200 mg via ORAL
  Filled 2019-11-04 (×3): qty 1
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
## 2019-11-04 NOTE — Progress Notes (Signed)
Spoke with RN at [facility] regarding order for consult with MFM doctor.
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
## 2019-11-04 NOTE — Progress Notes (Signed)
Pt arrived to BP for NST. Provider reviewed strip and determined appropriate for gestation and reactive. Pt returned to MB.

## 2019-11-04 NOTE — Progress Notes (Signed)
MFM at bedside to assess patient. RN and Dennard Nip, CNM at bedside during consult.

## 2019-11-04 NOTE — Progress Notes (Signed)
S: At bedside during consult with Dr. Donalee Citrin. Patient is calm, answering questions appropriately. She continues to have frontal/orbital headache. She denies visual changes or epigastric pain. Her headache was relieved by tylenol a couple of days ago and has worsened since then without relief. Her questions were answered and she verbalizes understanding of POC.  O: Vital Signs: BP (!) 148/89   Pulse 75   Temp 98.3 F (36.8 C) (Oral)   Resp 16   Ht 5\' 5"  (1.651 m)   Wt 76.2 kg   LMP 05/06/2019 (Exact Date)   BMI 27.96 kg/m  Constitutional: Well nourished, well developed female in no acute distress.  HEENT: normal Skin: Warm and dry.  Cardiovascular: Regular rate and rhythm.   Extremity: trace edema lower right extremity  Respiratory: Clear to auscultation bilateral. Normal respiratory effort Abdomen: FHT present Psych: Alert and Oriented x3. No memory deficits. Normal mood and affect.   Pelvic exam: deferred Toco: negative for contractions Fetal Well Being: 135 bpm, moderate variability, + 10x10 accelerations, -decelerations  A: 39 y.o. G3 P11 female with IUP at 26 weeks, worsening chronic hypertension with a few severe range treated with IV labetalol, headache treated with tylenol, IV Benadryl and IV Compazine  P:  Admit to inpatient for 24 to 48 hours as recommended by Dr Donalee Citrin for management of elevated blood pressure Elevated blood pressure: monitor for severe range and treat as needed. Adjust PO anti-hypertensives as needed with goal of mild range BP Regular diet NST Q12 hours   Christean Leaf, [facility] 11/04/2019, 11:58 AM

## 2019-11-04 NOTE — Progress Notes (Signed)
Patient transferred to mother baby unit.

## 2019-11-05 ENCOUNTER — Other Ambulatory Visit: Payer: Self-pay | Admitting: Family Medicine

## 2019-11-05 ENCOUNTER — Inpatient Hospital Stay (HOSPITAL_COMMUNITY)
Admission: EM | Admit: 2019-11-05 | Discharge: 2019-11-18 | DRG: 788 | Disposition: A | Discharge status: Home / Self Care | Payer: Medicaid Other | Source: Other Acute Inpatient Hospital | Attending: Obstetrics and Gynecology | Admitting: Obstetrics and Gynecology

## 2019-11-05 DIAGNOSIS — O1415 Severe pre-eclampsia, complicating the puerperium: Secondary | ICD-10-CM | POA: Diagnosis not present

## 2019-11-05 DIAGNOSIS — O26899 Other specified pregnancy related conditions, unspecified trimester: Secondary | ICD-10-CM

## 2019-11-05 DIAGNOSIS — O26892 Other specified pregnancy related conditions, second trimester: Secondary | ICD-10-CM

## 2019-11-05 DIAGNOSIS — Z3A27 27 weeks gestation of pregnancy: Secondary | ICD-10-CM | POA: Diagnosis not present

## 2019-11-05 DIAGNOSIS — Z3A26 26 weeks gestation of pregnancy: Secondary | ICD-10-CM

## 2019-11-05 DIAGNOSIS — O99893 Other specified diseases and conditions complicating puerperium: Secondary | ICD-10-CM | POA: Diagnosis not present

## 2019-11-05 DIAGNOSIS — O09522 Supervision of elderly multigravida, second trimester: Secondary | ICD-10-CM | POA: Diagnosis not present

## 2019-11-05 DIAGNOSIS — R200 Anesthesia of skin: Secondary | ICD-10-CM | POA: Diagnosis not present

## 2019-11-05 DIAGNOSIS — O351XX0 Maternal care for (suspected) chromosomal abnormality in fetus, not applicable or unspecified: Secondary | ICD-10-CM | POA: Diagnosis present

## 2019-11-05 DIAGNOSIS — O1002 Pre-existing essential hypertension complicating childbirth: Secondary | ICD-10-CM | POA: Diagnosis present

## 2019-11-05 DIAGNOSIS — O358XX0 Maternal care for other (suspected) fetal abnormality and damage, not applicable or unspecified: Secondary | ICD-10-CM | POA: Diagnosis present

## 2019-11-05 DIAGNOSIS — O112 Pre-existing hypertension with pre-eclampsia, second trimester: Secondary | ICD-10-CM | POA: Diagnosis not present

## 2019-11-05 DIAGNOSIS — R519 Headache, unspecified: Secondary | ICD-10-CM

## 2019-11-05 DIAGNOSIS — K219 Gastro-esophageal reflux disease without esophagitis: Secondary | ICD-10-CM | POA: Diagnosis present

## 2019-11-05 DIAGNOSIS — O10919 Unspecified pre-existing hypertension complicating pregnancy, unspecified trimester: Secondary | ICD-10-CM | POA: Diagnosis present

## 2019-11-05 DIAGNOSIS — O10012 Pre-existing essential hypertension complicating pregnancy, second trimester: Secondary | ICD-10-CM | POA: Diagnosis present

## 2019-11-05 DIAGNOSIS — O0932 Supervision of pregnancy with insufficient antenatal care, second trimester: Secondary | ICD-10-CM | POA: Diagnosis not present

## 2019-11-05 DIAGNOSIS — O9962 Diseases of the digestive system complicating childbirth: Secondary | ICD-10-CM | POA: Diagnosis present

## 2019-11-05 DIAGNOSIS — Z363 Encounter for antenatal screening for malformations: Secondary | ICD-10-CM | POA: Diagnosis not present

## 2019-11-05 DIAGNOSIS — O114 Pre-existing hypertension with pre-eclampsia, complicating childbirth: Principal | ICD-10-CM | POA: Diagnosis present

## 2019-11-05 DIAGNOSIS — O360120 Maternal care for anti-D [Rh] antibodies, second trimester, not applicable or unspecified: Secondary | ICD-10-CM | POA: Diagnosis not present

## 2019-11-05 DIAGNOSIS — O1413 Severe pre-eclampsia, third trimester: Secondary | ICD-10-CM

## 2019-11-05 DIAGNOSIS — O321XX0 Maternal care for breech presentation, not applicable or unspecified: Secondary | ICD-10-CM | POA: Diagnosis present

## 2019-11-05 DIAGNOSIS — Z6791 Unspecified blood type, Rh negative: Secondary | ICD-10-CM | POA: Diagnosis not present

## 2019-11-05 DIAGNOSIS — O09292 Supervision of pregnancy with other poor reproductive or obstetric history, second trimester: Secondary | ICD-10-CM | POA: Diagnosis not present

## 2019-11-05 DIAGNOSIS — O09529 Supervision of elderly multigravida, unspecified trimester: Secondary | ICD-10-CM

## 2019-11-05 DIAGNOSIS — O351XX9 Maternal care for (suspected) chromosomal abnormality in fetus, other fetus: Secondary | ICD-10-CM | POA: Diagnosis not present

## 2019-11-05 DIAGNOSIS — O162 Unspecified maternal hypertension, second trimester: Secondary | ICD-10-CM

## 2019-11-05 DIAGNOSIS — O10913 Unspecified pre-existing hypertension complicating pregnancy, third trimester: Secondary | ICD-10-CM

## 2019-11-05 DIAGNOSIS — O3510X0 Maternal care for (suspected) chromosomal abnormality in fetus, unspecified, not applicable or unspecified: Secondary | ICD-10-CM | POA: Diagnosis present

## 2019-11-05 DIAGNOSIS — O119 Pre-existing hypertension with pre-eclampsia, unspecified trimester: Secondary | ICD-10-CM

## 2019-11-05 DIAGNOSIS — O10912 Unspecified pre-existing hypertension complicating pregnancy, second trimester: Secondary | ICD-10-CM | POA: Diagnosis not present

## 2019-11-05 LAB — CBC
HCT: 36.2 % (ref 36.0–46.0)
Hemoglobin: 12.6 g/dL (ref 12.0–15.0)
MCH: 31.5 pg (ref 26.0–34.0)
MCHC: 34.8 g/dL (ref 30.0–36.0)
MCV: 90.5 fL (ref 80.0–100.0)
Platelets: 200 10*3/uL (ref 150–400)
RBC: 4 MIL/uL (ref 3.87–5.11)
RDW: 12.3 % (ref 11.5–15.5)
WBC: 10.2 10*3/uL (ref 4.0–10.5)
nRBC: 0 % (ref 0.0–0.2)

## 2019-11-05 LAB — COMPREHENSIVE METABOLIC PANEL
ALT: 23 U/L (ref 0–44)
AST: 26 U/L (ref 15–41)
Albumin: 3.3 g/dL — ABNORMAL LOW (ref 3.5–5.0)
Alkaline Phosphatase: 69 U/L (ref 38–126)
Anion gap: 11 (ref 5–15)
BUN: 7 mg/dL (ref 6–20)
CO2: 22 mmol/L (ref 22–32)
Calcium: 8.6 mg/dL — ABNORMAL LOW (ref 8.9–10.3)
Chloride: 103 mmol/L (ref 98–111)
Creatinine, Ser: 0.49 mg/dL (ref 0.44–1.00)
GFR calc Af Amer: >60 mL/min (ref >60)
GFR calc non Af Amer: >60 mL/min (ref >60)
Glucose, Bld: 147 mg/dL — ABNORMAL HIGH (ref 70–99)
Potassium: 3.7 mmol/L (ref 3.5–5.1)
Sodium: 136 mmol/L (ref 135–145)
Total Bilirubin: 0.6 mg/dL (ref 0.3–1.2)
Total Protein: 6.5 g/dL (ref 6.5–8.1)

## 2019-11-05 LAB — PROTEIN / CREATININE RATIO, URINE
Creatinine, Urine: 16 mg/dL
Total Protein, Urine: <6 mg/dL

## 2019-11-05 MED ORDER — NIFEDIPINE ER OSMOTIC RELEASE 30 MG PO TB24: 90.0000 mg | ORAL_TABLET | Freq: Every day | ORAL | Status: DC

## 2019-11-05 MED ORDER — LABETALOL HCL 5 MG/ML IV SOLN
20.0000 mg | INTRAVENOUS | Status: DC | PRN
  Administered 2019-11-05: 20 mg via INTRAVENOUS

## 2019-11-05 MED ORDER — LABETALOL HCL 5 MG/ML IV SOLN
40.0000 mg | INTRAVENOUS | Status: DC | PRN
  Administered 2019-11-05: 40 mg via INTRAVENOUS

## 2019-11-05 MED ORDER — BETAMETHASONE SOD PHOS & ACET 6 (3-3) MG/ML IJ SUSP
12.0000 mg | Freq: Once | INTRAMUSCULAR | Status: AC
  Administered 2019-11-05: 12 mg via INTRAMUSCULAR
  Filled 2019-11-05: qty 5
  Filled 2019-11-05: qty 2

## 2019-11-05 MED ORDER — MAGNESIUM SULFATE 40 GM/1000ML IV SOLN
2.0000 g/h | INTRAVENOUS | Status: DC
  Administered 2019-11-05: 2 g/h via INTRAVENOUS
  Filled 2019-11-05 (×3): qty 1000

## 2019-11-05 MED ORDER — NIFEDIPINE ER OSMOTIC RELEASE 30 MG PO TB24
ORAL_TABLET | ORAL | Status: AC
  Filled 2019-11-05: qty 1

## 2019-11-05 MED ORDER — NIFEDIPINE ER OSMOTIC RELEASE 30 MG PO TB24
30.0000 mg | ORAL_TABLET | Freq: Once | ORAL | Status: AC
  Administered 2019-11-05: 30 mg via ORAL

## 2019-11-05 MED ORDER — HYDRALAZINE HCL 20 MG/ML IJ SOLN: 10.0000 mg | INTRAMUSCULAR | Status: DC | PRN

## 2019-11-05 MED ORDER — LABETALOL HCL 100 MG PO TABS
400.0000 mg | ORAL_TABLET | Freq: Three times a day (TID) | ORAL | Status: DC
  Administered 2019-11-05 (×2): 400 mg via ORAL
  Filled 2019-11-05 (×2): qty 4

## 2019-11-05 MED ORDER — MAGNESIUM SULFATE BOLUS VIA INFUSION
4.0000 g | Freq: Once | INTRAVENOUS | Status: AC
  Administered 2019-11-05: 4 g via INTRAVENOUS
  Filled 2019-11-05: qty 1000

## 2019-11-05 MED ORDER — SODIUM CHLORIDE 0.9 % IV SOLN: INTRAVENOUS | Status: DC

## 2019-11-05 MED ORDER — LABETALOL HCL 5 MG/ML IV SOLN: 80.0000 mg | INTRAVENOUS | Status: DC | PRN

## 2019-11-05 NOTE — Progress Notes (Signed)
Patient stable for transfer

## 2019-11-05 NOTE — OB Triage Note (Signed)
Preparing to transfer to [facility] care unit.

## 2019-11-05 NOTE — Discharge Summary (Addendum)
Physician Discharge Summary  Patient ID: Erica Atkins MRN: [id number] DOB/AGE: February 19, 1981 39 y.o.  Admit date: 11/03/2019 Discharge date: 11/05/2019  Admission Diagnoses:  Discharge Diagnoses:  Active Problems:   Elevated blood pressure affecting pregnancy in third trimester, antepartum   Chronic hypertension affecting pregnancy   Headache in pregnancy, antepartum, second trimester   Discharged Condition: stable  Hospital Course: Patient was admitted on 11/03/2019 for elevated blood pressures and a headache. Maternal-fetal medicine was consulted on 11/04/2019.  Recommendations were for observation and initiation of antihypertensive therapy.  Patient was monitored however severe range blood pressures persisted despite increases in blood pressure medications.  For this reason maternal-fetal medicine was reconsulted.  Recommendations for administration of betamethasone initiation of magnesium and transfer to higher care level facility were given.  These tasks were undertaken performed and transfer was completed on 11/05/2019.  Consults: Maternal Fetal Medicine  Significant Diagnostic Studies: labs: See EPIC  Treatments: steroids: betamethasone, antihypertensives, magnesium, and fiorcet  Discharge Exam: Blood pressure (!) 150/98, pulse 83, temperature 97.6 F (36.4 C), temperature source Oral, resp. rate 16, height 5\' 5"  (1.651 m), weight 76.2 kg, last menstrual period 05/06/2019, SpO2 98 %, unknown if currently breastfeeding. General appearance: alert and cooperative Resp: clear to auscultation bilaterally Cardio: regular rate and rhythm, S1, S2 normal, no murmur, click, rub or gallop GI: soft, non-tender; bowel sounds normal; no masses,  no organomegaly Extremities: extremities normal, atraumatic, no cyanosis or edema Skin: Skin color, texture, turgor normal. No rashes or lesions  Disposition: Discharge disposition: [place] Not Defined       Discharge  Instructions    Discharge patient   Complete by: As directed    Discharge disposition: [place] Not Defined   Discharge patient date: 11/05/2019     Allergies as of 11/05/2019      Reactions   Lactose Intolerance (gi) Other (See Comments)   Cramps, bloating, can't sleep   Other Shortness Of Breath   Feathers   Amitriptyline    Cymbalta [duloxetine Hcl]    Gabapentin    Robaxin [methocarbamol]    Tramadol    Can take with hydoxyzine Nausea       Medication List    STOP taking these medications   CitraNatal [place] 27-1-260 MG Caps   fluocinonide 0.05 % external solution Commonly known as: LIDEX   fluticasone 50 MCG/ACT nasal spray Commonly known as: FLONASE   ketoconazole 2 % cream Commonly known as: NIZORAL      Discharge planning took over an hour of care.  Signed: Homero Fellers 11/05/2019, 11:19 PM

## 2019-11-05 NOTE — Progress Notes (Signed)
Late entry note for 7:00 pm  Patient had a recurrence of severe elevated blood pressures.  Patient was treated with IV medicines.  Dr. Annamaria Boots with maternal-fetal medicine was contacted for phone consultation regarding the change in patient's status.  Discussed the case with him in detail.  Given the recurrence of the blood pressures despite high dosage of oral blood pressure medications he recommended giving the patient a course of betamethasone and starting IV magnesium.  Given the patient's young gestational age [redacted] weeks he also recommended transfer to a higher care facility in case delivery was needed.  He recommended initiating a 24-hour urine upon the patient's arrival at [facility].  He said that the MFM service would be available to see the patient in the morning for further consultation regarding her care.  Dr. Loma Boston was contacted at [facility] and was willing to accept the patient to his service.  The patient's clinical course was discussed with Dr. Sheran Lawless and Dr. Annamaria Boots in detail.  Transfer via [facility] was arranged.  Discussed the plan of care with the patient and her mother who are present in the room.  Patient reports that her headache continues to be mild and she notes new symptoms of feeling hot.  Adrian Prows MD, [facility], [facility] 11/05/2019 10:03 PM

## 2019-11-05 NOTE — Progress Notes (Signed)
Pt c/o "lingering headache". Refused pain medication at this time, wants to try drinking coffee this morning and continue ice packs to head/neck. Updated blood work ordered and to call staff when she voids to collect P/C ratio. Pt verbalized understanding. NT rounding now to get VS.

## 2019-11-05 NOTE — Progress Notes (Signed)
Patient taken back to [facility] per Gilman Schmidt MD verbal order. MD reviewed FHR strip and BP in L&D.

## 2019-11-05 NOTE — Progress Notes (Signed)
Subjective:  Erica Atkins reports that she her headache is a 1-2 in terms of severity. She is otherwise well. She is sittin gin bed. She has has a salad for breakfast. She reports normal fetal movement.   Objective:   Blood pressure (!) 146/89, pulse 77, temperature 97.8 F (36.6 C), temperature source Oral, resp. rate 16, height 5\' 5"  (1.651 m), weight 76.2 kg, last menstrual period 05/06/2019, SpO2 99 %,   General: NAD Pulmonary: no increased work of breathing Abdomen: non-distended, non-tender, gravid  Extremities: no edema, no erythema, no tenderness, no signs of DVT  Results for orders placed or performed during the hospital encounter of 11/03/19 (from the past 72 hour(s))  Protein / creatinine ratio, urine     Status: None   Collection Time: 11/03/19 10:54 PM  Result Value Ref Range   Creatinine, Urine <10 mg/dL   Total Protein, Urine <6 mg/dL    Comment: NO NORMAL RANGE ESTABLISHED FOR THIS TEST   Protein Creatinine Ratio        0.00 - 0.15 mg/mg[Cre]    Comment: RESULT BELOW REPORTABLE RANGE, UNABLE TO CALCULATE. Performed at [facility], [address] [place], [place] [address]   CBC     Status: Abnormal   Collection Time: 11/03/19 11:14 PM  Result Value Ref Range   WBC 12.2 (H) 4.0 - 10.5 K/uL   RBC 3.96 3.87 - 5.11 MIL/uL   Hemoglobin 12.5 12.0 - 15.0 g/dL   HCT 34.1 (L) 36 - 46 %   MCV 86.1 80.0 - 100.0 fL   MCH 31.6 26.0 - 34.0 pg   MCHC 36.7 (H) 30.0 - 36.0 g/dL   RDW 12.3 11.5 - 15.5 %   Platelets 208 150 - 400 K/uL   nRBC 0.0 0.0 - 0.2 %    Comment: Performed at [facility], [address] [place], [place] [address]  Comprehensive metabolic panel     Status: Abnormal   Collection Time: 11/03/19 11:14 PM  Result Value Ref Range   Sodium 137 135 - 145 mmol/L   Potassium 3.6 3.5 - 5.1 mmol/L   Chloride 107 98 - 111 mmol/L   CO2 21 (L) 22 - 32 mmol/L   Glucose, Bld 81 70 - 99 mg/dL    Comment: Glucose reference range applies only to  samples taken after fasting for at least 8 hours.   BUN 11 6 - 20 mg/dL   Creatinine, Ser 0.34 (L) 0.44 - 1.00 mg/dL   Calcium 8.6 (L) 8.9 - 10.3 mg/dL   Total Protein 6.2 (L) 6.5 - 8.1 g/dL   Albumin 3.2 (L) 3.5 - 5.0 g/dL   AST 27 15 - 41 U/L   ALT 24 0 - 44 U/L   Alkaline Phosphatase 75 38 - 126 U/L   Total Bilirubin 0.6 0.3 - 1.2 mg/dL   GFR calc non Af Amer >60 >60 mL/min   GFR calc Af Amer >60 >60 mL/min   Anion gap 9 5 - 15    Comment: Performed at [facility], [address] [place], [place] [address]  Sample to Blood Bank     Status: None   Collection Time: 11/03/19 11:14 PM  Result Value Ref Range   Blood Bank Specimen      SAMPLE AVAILABLE FOR TESTING NO TYPE/SCREEN INDICATED PER ARACELI RODRIGUEZ 11/03/19 2330 RWW   Sample Expiration      11/06/2019,2359 Performed at [facility], [address], [place],  [address]  SARS Coronavirus 2 by RT PCR (hospital order, performed in [facility] hospital lab) Nasopharyngeal Nasopharyngeal Swab     Status: None   Collection Time: 11/04/19 12:09 PM   Specimen: Nasopharyngeal Swab  Result Value Ref Range   SARS Coronavirus 2 NEGATIVE NEGATIVE    Comment: (NOTE) SARS-CoV-2 target nucleic acids are NOT DETECTED.  The SARS-CoV-2 RNA is generally detectable in upper and lower respiratory specimens during the acute phase of infection. The lowest concentration of SARS-CoV-2 viral copies this assay can detect is 250 copies / mL. A negative result does not preclude SARS-CoV-2 infection and should not be used as the sole basis for treatment or other patient management decisions.  A negative result may occur with improper specimen collection / handling, submission of specimen other than nasopharyngeal swab, presence of viral mutation(s) within the areas targeted by this assay, and inadequate number of viral copies (<250 copies / mL). A negative result must be combined with clinical observations, patient  history, and epidemiological information.  Fact Sheet for Patients:   StrictlyIdeas.no  Fact Sheet for Healthcare Providers: BankingDealers.co.za  This test is not yet approved or  cleared by the [place] FDA and has been authorized for detection and/or diagnosis of SARS-CoV-2 by FDA under an Emergency Use Authorization (EUA).  This EUA will remain in effect (meaning this test can be used) for the duration of the COVID-19 declaration under Section 564(b)(1) of the Act, 21 U.S.C. section 360bbb-3(b)(1), unless the authorization is terminated or revoked sooner.  Performed at [facility], [address] [place], [place] [address]   Protein / creatinine ratio, urine     Status: None   Collection Time: 11/05/19  7:51 AM  Result Value Ref Range   Creatinine, Urine 16 mg/dL   Total Protein, Urine <6 mg/dL    Comment: NO NORMAL RANGE ESTABLISHED FOR THIS TEST   Protein Creatinine Ratio        0.00 - 0.15 mg/mg[Cre]    Comment: RESULT BELOW REPORTABLE RANGE, UNABLE TO CALCULATE. Performed at [facility], [address] [place], [place] [address]   CBC     Status: None   Collection Time: 11/05/19  9:27 AM  Result Value Ref Range   WBC 10.2 4.0 - 10.5 K/uL   RBC 4.00 3.87 - 5.11 MIL/uL   Hemoglobin 12.6 12.0 - 15.0 g/dL   HCT 36.2 36 - 46 %   MCV 90.5 80.0 - 100.0 fL   MCH 31.5 26.0 - 34.0 pg   MCHC 34.8 30.0 - 36.0 g/dL   RDW 12.3 11.5 - 15.5 %   Platelets 200 150 - 400 K/uL   nRBC 0.0 0.0 - 0.2 %    Comment: Performed at [facility], [address], [place], [place] [address]  Comprehensive metabolic panel     Status: Abnormal   Collection Time: 11/05/19  9:27 AM  Result Value Ref Range   Sodium 136 135 - 145 mmol/L   Potassium 3.7 3.5 - 5.1 mmol/L   Chloride 103 98 - 111 mmol/L   CO2 22 22 - 32 mmol/L   Glucose, Bld 147 (H) 70 - 99 mg/dL    Comment: Glucose reference range applies only to  samples taken after fasting for at least 8 hours.   BUN 7 6 - 20 mg/dL   Creatinine, Ser 0.49 0.44 - 1.00 mg/dL   Calcium 8.6 (L) 8.9 - 10.3 mg/dL   Total Protein 6.5 6.5 - 8.1 g/dL  Albumin 3.3 (L) 3.5 - 5.0 g/dL   AST 26 15 - 41 U/L   ALT 23 0 - 44 U/L   Alkaline Phosphatase 69 38 - 126 U/L   Total Bilirubin 0.6 0.3 - 1.2 mg/dL   GFR calc non Af Amer >60 >60 mL/min   GFR calc Af Amer >60 >60 mL/min   Anion gap 11 5 - 15    Comment: Performed at [facility], [address], [place], [place] [address]    Assessment:   39 y.o. [phone] hospital day   Plan:    1) Chronic hypertension, possible superimposed preeclampsia given new onset of frontal headache. Labs within normal limits. Will increase Procardia to 90mg  XL daily. Was given an extra 30 mg this afternoon. Increasing labetalol to 400 mg TID.  Discussed reason for close observation and management of blood pressures.  Discussed reasons that we would consider preterm delivery.  Recommended patient contact work and consider leave or reduced hours.  Continue care and observe blood pressures.   Adrian Prows MD, [facility], [facility] 11/05/2019 9:58 PM

## 2019-11-05 NOTE — Progress Notes (Signed)
BP recheck at 12pm elevated after patient spoke with Dr. Gilman Schmidt during rounds. Pt is feeling overwhelmed and anxious. Encouraged patient we are doing all we can to keep her and her baby safe and healthy. Promoted relaxation and rest and will recheck BP shortly.

## 2019-11-05 NOTE — Progress Notes (Addendum)
Patient to labor and delivery for NST. Patient reports lingering headache but denies blurry vision/seeing spots, RUQ pain or increase in swelling. Reflexes are +2 and absent clonus. Patient denies contractions, leaking of fluid and reports fetal movement. Patient reports being anxious after conversation with MD today but states she is feeling like she is calming down now. Monitors applied and assessing and will continue to monitor.

## 2019-11-06 ENCOUNTER — Inpatient Hospital Stay (HOSPITAL_BASED_OUTPATIENT_CLINIC_OR_DEPARTMENT_OTHER): Payer: Medicaid Other

## 2019-11-06 ENCOUNTER — Encounter (HOSPITAL_COMMUNITY): Payer: Self-pay | Admitting: Family Medicine

## 2019-11-06 ENCOUNTER — Encounter (HOSPITAL_COMMUNITY): Payer: Self-pay

## 2019-11-06 ENCOUNTER — Other Ambulatory Visit: Payer: Self-pay

## 2019-11-06 DIAGNOSIS — O09522 Supervision of elderly multigravida, second trimester: Secondary | ICD-10-CM

## 2019-11-06 DIAGNOSIS — O360120 Maternal care for anti-D [Rh] antibodies, second trimester, not applicable or unspecified: Secondary | ICD-10-CM | POA: Diagnosis not present

## 2019-11-06 DIAGNOSIS — O119 Pre-existing hypertension with pre-eclampsia, unspecified trimester: Secondary | ICD-10-CM | POA: Diagnosis present

## 2019-11-06 DIAGNOSIS — Z3A26 26 weeks gestation of pregnancy: Secondary | ICD-10-CM

## 2019-11-06 DIAGNOSIS — O112 Pre-existing hypertension with pre-eclampsia, second trimester: Secondary | ICD-10-CM

## 2019-11-06 DIAGNOSIS — O10919 Unspecified pre-existing hypertension complicating pregnancy, unspecified trimester: Secondary | ICD-10-CM

## 2019-11-06 DIAGNOSIS — Z363 Encounter for antenatal screening for malformations: Secondary | ICD-10-CM | POA: Diagnosis not present

## 2019-11-06 DIAGNOSIS — O09292 Supervision of pregnancy with other poor reproductive or obstetric history, second trimester: Secondary | ICD-10-CM | POA: Diagnosis not present

## 2019-11-06 DIAGNOSIS — O10912 Unspecified pre-existing hypertension complicating pregnancy, second trimester: Secondary | ICD-10-CM

## 2019-11-06 HISTORY — DX: Pre-existing hypertension with pre-eclampsia, unspecified trimester: O11.9

## 2019-11-06 LAB — COMPREHENSIVE METABOLIC PANEL
ALT: 26 U/L (ref 0–44)
AST: 28 U/L (ref 15–41)
Albumin: 3.1 g/dL — ABNORMAL LOW (ref 3.5–5.0)
Alkaline Phosphatase: 78 U/L (ref 38–126)
Anion gap: 11 (ref 5–15)
BUN: 9 mg/dL (ref 6–20)
CO2: 19 mmol/L — ABNORMAL LOW (ref 22–32)
Calcium: 7.9 mg/dL — ABNORMAL LOW (ref 8.9–10.3)
Chloride: 104 mmol/L (ref 98–111)
Creatinine, Ser: 0.55 mg/dL (ref 0.44–1.00)
GFR calc Af Amer: >60 mL/min (ref >60)
GFR calc non Af Amer: >60 mL/min (ref >60)
Glucose, Bld: 163 mg/dL — ABNORMAL HIGH (ref 70–99)
Potassium: 3.9 mmol/L (ref 3.5–5.1)
Sodium: 134 mmol/L — ABNORMAL LOW (ref 135–145)
Total Bilirubin: 0.3 mg/dL (ref 0.3–1.2)
Total Protein: 6 g/dL — ABNORMAL LOW (ref 6.5–8.1)

## 2019-11-06 LAB — TYPE AND SCREEN
ABO/RH(D): B NEG
Antibody Screen: NEGATIVE

## 2019-11-06 LAB — CBC
HCT: 36.9 % (ref 36.0–46.0)
Hemoglobin: 12.8 g/dL (ref 12.0–15.0)
MCH: 31.2 pg (ref 26.0–34.0)
MCHC: 34.7 g/dL (ref 30.0–36.0)
MCV: 90 fL (ref 80.0–100.0)
Platelets: 204 10*3/uL (ref 150–400)
RBC: 4.1 MIL/uL (ref 3.87–5.11)
RDW: 12.2 % (ref 11.5–15.5)
WBC: 13.7 10*3/uL — ABNORMAL HIGH (ref 4.0–10.5)
nRBC: 0 % (ref 0.0–0.2)

## 2019-11-06 MED ORDER — FLUTICASONE PROPIONATE 50 MCG/ACT NA SUSP
2.0000 | Freq: Every day | NASAL | Status: DC
  Administered 2019-11-06 – 2019-11-17 (×12): 2 via NASAL
  Filled 2019-11-06: qty 16

## 2019-11-06 MED ORDER — FAMOTIDINE 20 MG PO TABS
20.0000 mg | ORAL_TABLET | Freq: Every day | ORAL | Status: DC
  Administered 2019-11-06 – 2019-11-18 (×13): 20 mg via ORAL
  Filled 2019-11-06 (×13): qty 1

## 2019-11-06 MED ORDER — LABETALOL HCL 5 MG/ML IV SOLN
20.0000 mg | INTRAVENOUS | Status: DC | PRN
  Administered 2019-11-07 – 2019-11-17 (×4): 20 mg via INTRAVENOUS
  Filled 2019-11-06 (×4): qty 4

## 2019-11-06 MED ORDER — ONDANSETRON HCL 4 MG/2ML IJ SOLN
4.0000 mg | Freq: Once | INTRAMUSCULAR | Status: AC
  Administered 2019-11-06: 4 mg via INTRAVENOUS
  Filled 2019-11-06: qty 2

## 2019-11-06 MED ORDER — BETAMETHASONE SOD PHOS & ACET 6 (3-3) MG/ML IJ SUSP
12.0000 mg | Freq: Once | INTRAMUSCULAR | Status: AC
  Administered 2019-11-06: 12 mg via INTRAMUSCULAR
  Filled 2019-11-06: qty 5

## 2019-11-06 MED ORDER — LABETALOL HCL 5 MG/ML IV SOLN
80.0000 mg | INTRAVENOUS | Status: DC | PRN
  Administered 2019-11-09 – 2019-11-17 (×3): 80 mg via INTRAVENOUS
  Filled 2019-11-06 (×3): qty 16

## 2019-11-06 MED ORDER — LABETALOL HCL 200 MG PO TABS
400.0000 mg | ORAL_TABLET | Freq: Three times a day (TID) | ORAL | Status: DC
  Administered 2019-11-06 – 2019-11-08 (×9): 400 mg via ORAL
  Filled 2019-11-06 (×9): qty 2

## 2019-11-06 MED ORDER — PROMETHAZINE HCL 25 MG/ML IJ SOLN
25.0000 mg | Freq: Once | INTRAMUSCULAR | Status: AC
  Administered 2019-11-06: 25 mg via INTRAVENOUS
  Filled 2019-11-06: qty 1

## 2019-11-06 MED ORDER — LACTATED RINGERS IV SOLN: INTRAVENOUS | Status: DC

## 2019-11-06 MED ORDER — PRENATAL MULTIVITAMIN CH
1.0000 | ORAL_TABLET | Freq: Every day | ORAL | Status: DC
  Administered 2019-11-06 – 2019-11-08 (×3): 1 via ORAL
  Filled 2019-11-06 (×3): qty 1

## 2019-11-06 MED ORDER — DOCUSATE SODIUM 100 MG PO CAPS
100.0000 mg | ORAL_CAPSULE | Freq: Every day | ORAL | Status: DC
  Administered 2019-11-06 – 2019-11-18 (×11): 100 mg via ORAL
  Filled 2019-11-06 (×13): qty 1

## 2019-11-06 MED ORDER — LABETALOL HCL 5 MG/ML IV SOLN
40.0000 mg | INTRAVENOUS | Status: DC | PRN
  Administered 2019-11-09 – 2019-11-17 (×3): 40 mg via INTRAVENOUS
  Filled 2019-11-06 (×3): qty 8

## 2019-11-06 MED ORDER — NIFEDIPINE ER OSMOTIC RELEASE 30 MG PO TB24: 30.0000 mg | ORAL_TABLET | Freq: Two times a day (BID) | ORAL | Status: DC

## 2019-11-06 MED ORDER — RHO D IMMUNE GLOBULIN 1500 UNIT/2ML IJ SOSY
300.0000 ug | PREFILLED_SYRINGE | Freq: Once | INTRAMUSCULAR | Status: AC
  Administered 2019-11-06: 300 ug via INTRAVENOUS
  Filled 2019-11-06: qty 2

## 2019-11-06 MED ORDER — HYDRALAZINE HCL 20 MG/ML IJ SOLN
10.0000 mg | INTRAMUSCULAR | Status: DC | PRN
  Administered 2019-11-08 – 2019-11-17 (×5): 10 mg via INTRAVENOUS
  Filled 2019-11-06 (×5): qty 1

## 2019-11-06 MED ORDER — MAGNESIUM SULFATE 40 GM/1000ML IV SOLN
2.0000 g/h | INTRAVENOUS | Status: DC
  Administered 2019-11-06: 2 g/h via INTRAVENOUS
  Filled 2019-11-06: qty 1000

## 2019-11-06 MED ORDER — ZOLPIDEM TARTRATE 5 MG PO TABS
5.0000 mg | ORAL_TABLET | Freq: Every evening | ORAL | Status: DC | PRN
  Administered 2019-11-06 – 2019-11-11 (×3): 5 mg via ORAL
  Filled 2019-11-06 (×3): qty 1

## 2019-11-06 MED ORDER — ACETAMINOPHEN 325 MG PO TABS
650.0000 mg | ORAL_TABLET | ORAL | Status: DC | PRN
  Administered 2019-11-06 – 2019-11-10 (×5): 650 mg via ORAL
  Filled 2019-11-06 (×5): qty 2

## 2019-11-06 MED ORDER — BUTALBITAL-APAP-CAFFEINE 50-325-40 MG PO TABS
1.0000 | ORAL_TABLET | Freq: Four times a day (QID) | ORAL | Status: DC | PRN
  Administered 2019-11-06: 2 via ORAL
  Administered 2019-11-06: 1 via ORAL
  Administered 2019-11-06: 2 via ORAL
  Administered 2019-11-07: 1 via ORAL
  Administered 2019-11-08 – 2019-11-10 (×4): 2 via ORAL
  Filled 2019-11-06 (×4): qty 2
  Filled 2019-11-06: qty 1
  Filled 2019-11-06: qty 2
  Filled 2019-11-06: qty 1
  Filled 2019-11-06: qty 2

## 2019-11-06 MED ORDER — CALCIUM CARBONATE ANTACID 500 MG PO CHEW
2.0000 | CHEWABLE_TABLET | ORAL | Status: DC | PRN
  Administered 2019-11-06 – 2019-11-07 (×3): 400 mg via ORAL
  Filled 2019-11-06 (×3): qty 2

## 2019-11-06 NOTE — Consult Note (Addendum)
MFM Note  Erica Atkins was seen for a follow-up consultation due to severe range blood pressures and a persistent headache.  The patient was seen at [facility] a few days ago by Dr. Donalee Citrin.  She was transferred from [facility] to [facility] last night as she had a severe headache along with severe range blood pressures that required treatment with IV labetalol.  She was transferred due to her early gestational age so that her baby could get the appropriate level of NICU care should she require delivery.  The patient had been placed on magnesium sulfate for maternal seizure prophylaxis and received the first dose of Celestone prior to being transferred.  Since being admitted to [facility], the patient has been treated with labetalol 400 mg 3 times a day and she has been continued on magnesium sulfate for maternal seizure prophylaxis.  The patient continues to complain of a headache primarily pain behind her eyes.  Her blood pressures overnight were in the 130s to 140s over 80s to 90s.  Her [place] labs on admission were within normal limits.  She currently has a 24-hour urine being collected.  Her P/C ratio at [place] did not indicate significant proteinuria.  Her deep tendon reflexes are 3+ bilaterally.  There were no signs of clonus noted today.  On an ultrasound exam performed today, the overall EFW measures appropriate for her gestational age.  There was normal amniotic fluid noted.  As this was a bedside ultrasound, the views of the fetal anatomy were limited.  Her NST has been reassuring for her gestational age.  The patient was advised that her continued headache along with her brisk reflexes are concerning signs that may indicate that she may be developing severe preeclampsia.    At her current gestational age (26 weeks 2 days), we will try to do everything possible to delay delivery so that she can reach a more optimal gestational age.    She should continue magnesium sulfate  for maternal seizure prophylaxis until tomorrow and should receive the second dose of Celestone later this evening.    Her blood pressures should be controlled using labetalol as prescribed.  Should her blood pressures continue to increase, the dosage of labetalol may be increased and her Procardia may be restarted.    Due to her continued headaches and elevated blood pressures, I would recommend that she remain in the hospital for close observation.  Should her 24-hour urine indicate significant proteinuria (greater than 300 mg), I would recommend that she remain in the hospital until delivery.    She should have [place] labs evaluated frequently (daily should her blood pressures remain elevated or 2-3 times a week should her blood pressures be less than 140/90).  Should she require delivery before 32 weeks, magnesium sulfate would be indicated for fetal neuroprotection prior to delivery.    She should receive a rescue course of antenatal corticosteroids should she require delivery at 34 weeks or less and it has been 7 or more days since she received the initial course.  She should have weekly biophysical profiles and amniotic fluid checks.  Another growth ultrasound should be scheduled in 3 weeks should she remain undelivered.  The patient understands that delivery is the only treatment for preeclampsia.  Hopefully, we will be able to delay delivery until she reaches a more optimal gestational age (99-32 weeks or greater).  The indications for immediate delivery would be:  -If her [place] labs show any abnormalities. -Should her blood  pressures be persistently greater than 160s over 100s despite medication treatment. -At any time for nonreassuring fetal status. -Should her headaches worsen or should she develop any other signs and symptoms of severe preeclampsia.  This note was copied from an original document that was generated using the AS ultrasound reporting software.
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
## 2019-11-06 NOTE — Consult Note (Signed)
Neonatology Consult to Antenatal Patient:  I was asked by Dr. Nehemiah Settle to see this patient in order to provide antenatal counseling due to preamturity.  Erica Atkins was admitted 8/11 at 26.[redacted] weeks GA. She is currently not having active labor. She is getting BMZ and IV Mag plus antihypertensives.  Pregnancy complicated by chronic hypertension, GERD, advanced maternal age, genetic testing positive for Klinefelter's syndrome (explained this should not complicate early neonate course).  Normal fetal anatomy US.    I spoke with the patient and her mother (FOB was at home). We discussed the worst case of delivery in the next 1-2 days, including usual DR management, possible respiratory complications and need for support, IV access, feedings (mother desires breast feeding, which was encouraged and agrees to donor breast milk), LOS, Mortality and Morbidity, and long term outcomes. She did have usual questions at this time which I answered.  I/we would be glad to come back if she has more questions later.  Thank you for asking me to see this patient.  Erica Sabal Katherina Mires, MD Neonatologist  The total length of face-to-face or floor/unit time for this encounter was 25 minutes. Counseling and/or coordination of care was 40 minutes of the above.

## 2019-11-06 NOTE — Progress Notes (Signed)
FACULTY PRACTICE ANTEPARTUM PROGRESS NOTE  Erica Atkins is a 39 y.o. G3P0020 at 108w2d who is admitted for preeclampsia.  Estimated Date of Delivery: 02/10/20 Fetal presentation is cephalic.  Length of Stay:  1 Days. Admitted 11/05/2019  Subjective: Pt currently doing well, still notes mild headache.  She denies RUQ pain and visual changes. Patient reports normal fetal movement.  She denies uterine contractions, denies bleeding and leaking of fluid per vagina.  Vitals:  Blood pressure (!) 140/91, pulse 78, temperature 97.8 F (36.6 C), temperature source Oral, resp. rate 18, height 5\' 5"  (1.651 m), weight 81.6 kg, last menstrual period 05/06/2019, SpO2 100 %, unknown if currently breastfeeding. Physical Examination: CONSTITUTIONAL: Well-developed, well-nourished female in no acute distress.  HENT:  Normocephalic, atraumatic, External right and left ear normal. Oropharynx is clear and moist NEUROLGIC: Alert and oriented to person, place, and time. Normal reflexes, muscle tone coordination. No cranial nerve deficit noted. PSYCHIATRIC: Normal mood and affect. Normal behavior. Normal judgment and thought content. CARDIOVASCULAR: Normal heart rate noted, regular rhythm RESPIRATORY: Effort and breath sounds normal, no problems with respiration noted MUSCULOSKELETAL: Normal range of motion. No edema and no tenderness.  3+ DTR ABDOMEN: Soft, nontender, nondistended, gravid. CERVIX: deferred  Fetal monitoring: FHR: 115 bpm, Variability: moderate, Accelerations: Present, Decelerations: Absent  Uterine activity: none  Results for orders placed or performed during the hospital encounter of 11/05/19 (from the past 48 hour(s))  CBC     Status: Abnormal   Collection Time: 11/06/19  1:49 AM  Result Value Ref Range   WBC 13.7 (H) 4.0 - 10.5 K/uL   RBC 4.10 3.87 - 5.11 MIL/uL   Hemoglobin 12.8 12.0 - 15.0 g/dL   HCT 36.9 36 - 46 %   MCV 90.0 80.0 - 100.0 fL   MCH 31.2 26.0 - 34.0 pg   MCHC 34.7 30.0  - 36.0 g/dL   RDW 12.2 11.5 - 15.5 %   Platelets 204 150 - 400 K/uL   nRBC 0.0 0.0 - 0.2 %    Comment: Performed at [facility], [place] [address], [place], [place] [address]  Comprehensive metabolic panel     Status: Abnormal   Collection Time: 11/06/19  1:49 AM  Result Value Ref Range   Sodium 134 (L) 135 - 145 mmol/L   Potassium 3.9 3.5 - 5.1 mmol/L   Chloride 104 98 - 111 mmol/L   CO2 19 (L) 22 - 32 mmol/L   Glucose, Bld 163 (H) 70 - 99 mg/dL    Comment: Glucose reference range applies only to samples taken after fasting for at least 8 hours.   BUN 9 6 - 20 mg/dL   Creatinine, Ser 0.55 0.44 - 1.00 mg/dL   Calcium 7.9 (L) 8.9 - 10.3 mg/dL   Total Protein 6.0 (L) 6.5 - 8.1 g/dL   Albumin 3.1 (L) 3.5 - 5.0 g/dL   AST 28 15 - 41 U/L   ALT 26 0 - 44 U/L   Alkaline Phosphatase 78 38 - 126 U/L   Total Bilirubin 0.3 0.3 - 1.2 mg/dL   GFR calc non Af Amer >60 >60 mL/min   GFR calc Af Amer >60 >60 mL/min   Anion gap 11 5 - 15    Comment: Performed at [facility] [address] [place], [place] [address]  Type and screen     Status: None   Collection Time: 11/06/19  1:49 AM  Result Value Ref Range   ABO/RH(D) B NEG  Antibody Screen NEG    Sample Expiration      11/09/2019,2359 Performed at [facility], [place] [address] [place], [place] [address]   Rh IG workup (includes ABO/Rh)     Status: None (Preliminary result)   Collection Time: 11/06/19 12:30 PM  Result Value Ref Range   Gestational Age(Wks) 26.2    ABO/RH(D)      B NEG Performed at [facility] [address], [place], [place] [address]    Unit Number [id number]    Blood Component Type RHIG    Unit division 00    Status of Unit ALLOCATED    Transfusion Status OK TO TRANSFUSE     I have reviewed the patient's current medications.  ASSESSMENT: Active Problems:   Rh negative status during pregnancy   Advanced maternal age in multigravida   Aneuploidy of fetus affecting management  of mother in singleton pregnancy   Chronic hypertension affecting pregnancy   Chronic hypertension with superimposed preeclampsia   PLAN: Spoke with Dr. Annamaria Boots on the phone.  He noted normal AFI and fetal growth.    Recommendations 1. Continue inpatient stay with possible delivery if neurological signs worsen and severe range pressures become more apparent. 2.Continue magnesium sulfate until tomorrow morning 3. Receive second dose BMZ at 1900 4. Recheck CBC/CMP in the AM   Continue routine antenatal care.   Lynnda Shields, MD [place] Attending, Center for [facility] 11/06/2019 2:30 PM

## 2019-11-07 DIAGNOSIS — O351XX9 Maternal care for (suspected) chromosomal abnormality in fetus, other fetus: Secondary | ICD-10-CM

## 2019-11-07 DIAGNOSIS — O0932 Supervision of pregnancy with insufficient antenatal care, second trimester: Secondary | ICD-10-CM

## 2019-11-07 LAB — RH IG WORKUP (INCLUDES ABO/RH)
ABO/RH(D): B NEG
Gestational Age(Wks): 26.2
Unit division: 0

## 2019-11-07 LAB — CREATININE, URINE, 24 HOUR
Collection Interval-UCRE24: 24 hours
Creatinine, 24H Ur: 1039 mg/d (ref 600–1800)
Creatinine, Urine: 21.21 mg/dL
Urine Total Volume-UCRE24: 4900 mL

## 2019-11-07 LAB — PROTEIN, URINE, 24 HOUR
Collection Interval-UPROT: 24 hours
Protein, Urine: <6 mg/dL
Urine Total Volume-UPROT: 4900 mL

## 2019-11-07 NOTE — Progress Notes (Signed)
FACULTY PRACTICE ANTEPARTUM PROGRESS NOTE  Erica Atkins is a 39 y.o. G3P0020 at [redacted]w[redacted]d who is admitted for chronic hypertension with superimposed preeclampsia.  Estimated Date of Delivery: 02/10/20 Fetal presentation is cephalic.  Length of Stay:  2 Days. Admitted 11/05/2019  Subjective: Pt seen, doing well this morning.  She states she felt a little dizzy, but attributes this to the magnesium sulfate which was just discontinued.  She denies visual changes, RUQ pain and only notes extremely mild frontal headache. Patient reports normal fetal movement.  She denies uterine contractions, denies bleeding and leaking of fluid per vagina.  Vitals:  Blood pressure 138/83, pulse 76, temperature 98.2 F (36.8 C), temperature source Oral, resp. rate 18, height 5\' 5"  (1.651 m), weight 81.6 kg, last menstrual period 05/06/2019, SpO2 99 %, unknown if currently breastfeeding. Physical Examination: CONSTITUTIONAL: Well-developed, well-nourished female in no acute distress.  HENT:  Normocephalic, atraumatic, External right and left ear normal. Oropharynx is clear and moist EYES: Conjunctivae and EOM are normal. Pupils are equal, round, and reactive to light. No scleral icterus.  [place]: Alert and oriented to person, place, and time. Normal reflexes, muscle tone coordination. No cranial nerve deficit noted.  DTR 2+ PSYCHIATRIC: Normal mood and affect. Normal behavior. Normal judgment and thought content. CARDIOVASCULAR: Normal heart rate noted, regular rhythm RESPIRATORY: Effort and breath sounds normal, no problems with respiration noted MUSCULOSKELETAL: Normal range of motion. No edema and no tenderness. ABDOMEN: Soft, nontender, nondistended, gravid. CERVIX: deferred  Fetal monitoring: FHR: 118 bpm, Variability: moderate, Accelerations: Present, Decelerations: Absent  Uterine activity: none   Results for orders placed or performed during the hospital encounter of 11/05/19 (from the past 48 hour(s))   Protein, urine, 24 hour     Status: None   Collection Time: 11/06/19 12:30 AM  Result Value Ref Range   Urine Total Volume-UPROT 4,900 mL   Collection Interval-UPROT 24 hours   Protein, Urine <6 mg/dL   Protein, 24H Urine NOT CALCULATED 50 - 100 mg/day    Comment: Performed at [facility], [place] [address], [place], [place] [address]  Creatinine, urine, 24 hour     Status: None   Collection Time: 11/06/19 12:30 AM  Result Value Ref Range   Urine Total Volume-UCRE24 4,900 mL   Collection Interval-UCRE24 24 hours   Creatinine, Urine 21.21 mg/dL   Creatinine, 24H Ur 1,039 600 - 1,800 mg/day    Comment: Performed at [facility], [place] [address] [place], [place] [address]  CBC     Status: Abnormal   Collection Time: 11/06/19  1:49 AM  Result Value Ref Range   WBC 13.7 (H) 4.0 - 10.5 K/uL   RBC 4.10 3.87 - 5.11 MIL/uL   Hemoglobin 12.8 12.0 - 15.0 g/dL   HCT 36.9 36 - 46 %   MCV 90.0 80.0 - 100.0 fL   MCH 31.2 26.0 - 34.0 pg   MCHC 34.7 30.0 - 36.0 g/dL   RDW 12.2 11.5 - 15.5 %   Platelets 204 150 - 400 K/uL   nRBC 0.0 0.0 - 0.2 %    Comment: Performed at [facility], [place] [address], [place], [place] [address]  Comprehensive metabolic panel     Status: Abnormal   Collection Time: 11/06/19  1:49 AM  Result Value Ref Range   Sodium 134 (L) 135 - 145 mmol/L   Potassium 3.9 3.5 - 5.1 mmol/L   Chloride 104 98 - 111 mmol/L   CO2 19 (L)  22 - 32 mmol/L   Glucose, Bld 163 (H) 70 - 99 mg/dL    Comment: Glucose reference range applies only to samples taken after fasting for at least 8 hours.   BUN 9 6 - 20 mg/dL   Creatinine, Ser 0.55 0.44 - 1.00 mg/dL   Calcium 7.9 (L) 8.9 - 10.3 mg/dL   Total Protein 6.0 (L) 6.5 - 8.1 g/dL   Albumin 3.1 (L) 3.5 - 5.0 g/dL   AST 28 15 - 41 U/L   ALT 26 0 - 44 U/L   Alkaline Phosphatase 78 38 - 126 U/L   Total Bilirubin 0.3 0.3 - 1.2 mg/dL   GFR calc non Af Amer >60 >60 mL/min   GFR calc Af Amer >60 >60 mL/min   Anion gap 11 5 -  15    Comment: Performed at [facility] [address] [place], [place] [address]  Type and screen     Status: None   Collection Time: 11/06/19  1:49 AM  Result Value Ref Range   ABO/RH(D) B NEG    Antibody Screen NEG    Sample Expiration      11/09/2019,2359 Performed at [facility], [place] [address], [place], [place] [address]   Rh IG workup (includes ABO/Rh)     Status: None (Preliminary result)   Collection Time: 11/06/19 12:30 PM  Result Value Ref Range   Gestational Age(Wks) 26.2    ABO/RH(D) B NEG    Unit Number [id number]    Blood Component Type RHIG    Unit division 00    Status of Unit ISSUED    Transfusion Status      OK TO TRANSFUSE Performed at [facility] [address], [place], [place] [address]     I have reviewed the patient's current medications.  ASSESSMENT: Active Problems:   Rh negative status during pregnancy   Advanced maternal age in multigravida   Aneuploidy of fetus affecting management of mother in singleton pregnancy   Chronic hypertension affecting pregnancy   Chronic hypertension with superimposed preeclampsia   PLAN:  Chronic hypertension: continue labetalol 400 mg TID, weekly BPP/AFI will be initiated tomorrow Magnesium sulfate was discontinued. Pt s/p BMZ Daily NST   Continue routine antenatal care.   Lynnda Shields, MD [place] Attending, Center for [facility] 11/07/2019 10:36 AM
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
## 2019-11-08 LAB — COMPREHENSIVE METABOLIC PANEL
ALT: 59 U/L — ABNORMAL HIGH (ref 0–44)
AST: 57 U/L — ABNORMAL HIGH (ref 15–41)
Albumin: 2.7 g/dL — ABNORMAL LOW (ref 3.5–5.0)
Alkaline Phosphatase: 63 U/L (ref 38–126)
Anion gap: 10 (ref 5–15)
BUN: 13 mg/dL (ref 6–20)
CO2: 19 mmol/L — ABNORMAL LOW (ref 22–32)
Calcium: 7.9 mg/dL — ABNORMAL LOW (ref 8.9–10.3)
Chloride: 107 mmol/L (ref 98–111)
Creatinine, Ser: 0.55 mg/dL (ref 0.44–1.00)
GFR calc Af Amer: >60 mL/min (ref >60)
GFR calc non Af Amer: >60 mL/min (ref >60)
Glucose, Bld: 117 mg/dL — ABNORMAL HIGH (ref 70–99)
Potassium: 3.9 mmol/L (ref 3.5–5.1)
Sodium: 136 mmol/L (ref 135–145)
Total Bilirubin: 0.5 mg/dL (ref 0.3–1.2)
Total Protein: 5.3 g/dL — ABNORMAL LOW (ref 6.5–8.1)

## 2019-11-08 LAB — CBC
HCT: 34.1 % — ABNORMAL LOW (ref 36.0–46.0)
Hemoglobin: 11.3 g/dL — ABNORMAL LOW (ref 12.0–15.0)
MCH: 31 pg (ref 26.0–34.0)
MCHC: 33.1 g/dL (ref 30.0–36.0)
MCV: 93.4 fL (ref 80.0–100.0)
Platelets: 179 10*3/uL (ref 150–400)
RBC: 3.65 MIL/uL — ABNORMAL LOW (ref 3.87–5.11)
RDW: 12.7 % (ref 11.5–15.5)
WBC: 10.3 10*3/uL (ref 4.0–10.5)
nRBC: 0 % (ref 0.0–0.2)

## 2019-11-08 MED ORDER — NIFEDIPINE ER OSMOTIC RELEASE 30 MG PO TB24
30.0000 mg | ORAL_TABLET | Freq: Every day | ORAL | Status: DC
  Administered 2019-11-08: 30 mg via ORAL
  Filled 2019-11-08: qty 1

## 2019-11-08 NOTE — Progress Notes (Signed)
Patient loss IV access. Pt requested not to restart one. Pt informed if needed, IV would be restarted. Toya Smothers, RN

## 2019-11-08 NOTE — Progress Notes (Signed)
Erica Atkins was celebrating hearing the good news that she may be able to go home on Tuesday.  She was spending time with her husband and did not wish to talk further today.  [place], [place] Pager, [phone]

## 2019-11-08 NOTE — Progress Notes (Signed)
FACULTY PRACTICE ANTEPARTUM PROGRESS NOTE  Erica Atkins is a 39 y.o. G3P0020 at [redacted]w[redacted]d who is admitted for severe preeclampsia.  Estimated Date of Delivery: 02/10/20 Fetal presentation is cephalic.  Length of Stay:  3 Days. Admitted 11/05/2019  Subjective: Pt seen, doing well.  No acute complaints.  She notes very mild headache, but has taken no meds at this time.  She denies visual changes and RUQ pain. Patient reports normal fetal movement.  She denies uterine contractions, denies bleeding and leaking of fluid per vagina.  Vitals:  Blood pressure (!) 144/88, pulse 67, temperature 98.2 F (36.8 C), temperature source Oral, resp. rate 17, height 5\' 5"  (1.651 m), weight 81.6 kg, last menstrual period 05/06/2019, SpO2 97 %, unknown if currently breastfeeding. Physical Examination: CONSTITUTIONAL: Well-developed, well-nourished female in no acute distress.  HENT:  Normocephalic, atraumatic, External right and left ear normal. Oropharynx is clear and moist SKIN: Skin is warm and dry. No rash noted. Not diaphoretic. No erythema. No pallor. [place]: Alert and oriented to person, place, and time. Normal reflexes, muscle tone coordination. No cranial nerve deficit noted. PSYCHIATRIC: Normal mood and affect. Normal behavior. Normal judgment and thought content. CARDIOVASCULAR: Normal heart rate noted, regular rhythm RESPIRATORY: Effort and breath sounds normal, no problems with respiration noted MUSCULOSKELETAL: Normal range of motion. No edema and no tenderness. DTR 1+ ABDOMEN: Soft, nontender, nondistended, gravid. CERVIX: deferred  Fetal monitoring: pending for today, will follow  Results for orders placed or performed during the hospital encounter of 11/05/19 (from the past 48 hour(s))  Rh IG workup (includes ABO/Rh)     Status: None   Collection Time: 11/06/19 12:30 PM  Result Value Ref Range   Gestational Age(Wks) 26.2    ABO/RH(D) B NEG    Unit Number [id number]    Blood Component  Type RHIG    Unit division 00    Status of Unit ISSUED,FINAL    Transfusion Status      OK TO TRANSFUSE Performed at [facility] [address], [place], [place] [address]     I have reviewed the patient's current medications.  ASSESSMENT: Active Problems:   Rh negative status during pregnancy   Advanced maternal age in multigravida   Aneuploidy of fetus affecting management of mother in singleton pregnancy   Chronic hypertension affecting pregnancy   Chronic hypertension with superimposed preeclampsia   PLAN: Initiate weekly BPP at 28 weeks per MFM Limited u/s on Monday Continue daily NST Recheck CBC and CMP today 24 hour urine did not show significant proteinuria Continue po labetalol, procardia can be added as needed  Continue routine antenatal care.   Lynnda Shields, MD [place] Attending, [facility] 11/08/2019 9:22 AM

## 2019-11-08 NOTE — Progress Notes (Signed)
Subjective: Interval History: Pt denies headache currently.  Elevated blood pressure noted at 1135 with severe range pressures.  Procardia was restarted.  Last BP was 154/88 Objective: Vital signs in last 24 hours: Temp:  [97.9 F (36.6 C)-98.3 F (36.8 C)] 98.1 F (36.7 C) (08/13 1135) Pulse Rate:  [67-83] 75 (08/13 1135) Resp:  [16-18] 17 (08/13 1135) BP: (125-162)/(71-95) 154/88 (08/13 1215) SpO2:  [97 %-99 %] 99 % (08/13 1135) Weight change:   Intake/Output from previous day: 08/12 0701 - 08/13 0700 In: 1020 [P.O.:1020] Out: 2550 [Urine:2550] Intake/Output this shift: Total I/O In: 0  Out: 300 [Urine:300]   Lab Results: Recent Labs    11/06/19 0149 11/08/19 1044  WBC 13.7* 10.3  HGB 12.8 11.3*  HCT 36.9 34.1*  PLT 204 179   BMET:  Recent Labs    11/06/19 0149 11/08/19 1044  NA 134* 136  K 3.9 3.9  CL 104 107  CO2 19* 19*  GLUCOSE 163* 117*  BUN 9 13  CREATININE 0.55 0.55  CALCIUM 7.9* 7.9*    AST 57 increased from 28 ALT 59 increased from 26  I have reviewed the patient's current medications.  Assessment/Plan: Continue serial BP  Recheck CBC and CMP in AM Will get MFM input at this time. Monitor patient closely   LOS: 3 days   Griffin Basil 11/08/2019,3:55 PM

## 2019-11-09 LAB — CBC WITH DIFFERENTIAL/PLATELET
Abs Immature Granulocytes: 0.03 10*3/uL (ref 0.00–0.07)
Basophils Absolute: 0 10*3/uL (ref 0.0–0.1)
Basophils Relative: 0 %
Eosinophils Absolute: 0.1 10*3/uL (ref 0.0–0.5)
Eosinophils Relative: 1 %
HCT: 35.6 % — ABNORMAL LOW (ref 36.0–46.0)
Hemoglobin: 12 g/dL (ref 12.0–15.0)
Immature Granulocytes: 0 %
Lymphocytes Relative: 18 %
Lymphs Abs: 1.6 10*3/uL (ref 0.7–4.0)
MCH: 31.7 pg (ref 26.0–34.0)
MCHC: 33.7 g/dL (ref 30.0–36.0)
MCV: 94.2 fL (ref 80.0–100.0)
Monocytes Absolute: 0.9 10*3/uL (ref 0.1–1.0)
Monocytes Relative: 10 %
Neutro Abs: 6.5 10*3/uL (ref 1.7–7.7)
Neutrophils Relative %: 71 %
Platelets: 178 10*3/uL (ref 150–400)
RBC: 3.78 MIL/uL — ABNORMAL LOW (ref 3.87–5.11)
RDW: 12.5 % (ref 11.5–15.5)
WBC: 9.1 10*3/uL (ref 4.0–10.5)
nRBC: 0 % (ref 0.0–0.2)

## 2019-11-09 LAB — COMPREHENSIVE METABOLIC PANEL
ALT: 66 U/L — ABNORMAL HIGH (ref 0–44)
AST: 55 U/L — ABNORMAL HIGH (ref 15–41)
Albumin: 2.6 g/dL — ABNORMAL LOW (ref 3.5–5.0)
Alkaline Phosphatase: 63 U/L (ref 38–126)
Anion gap: 10 (ref 5–15)
BUN: 11 mg/dL (ref 6–20)
CO2: 20 mmol/L — ABNORMAL LOW (ref 22–32)
Calcium: 8.1 mg/dL — ABNORMAL LOW (ref 8.9–10.3)
Chloride: 108 mmol/L (ref 98–111)
Creatinine, Ser: 0.54 mg/dL (ref 0.44–1.00)
GFR calc Af Amer: >60 mL/min (ref >60)
GFR calc non Af Amer: >60 mL/min (ref >60)
Glucose, Bld: 74 mg/dL (ref 70–99)
Potassium: 4.3 mmol/L (ref 3.5–5.1)
Sodium: 138 mmol/L (ref 135–145)
Total Bilirubin: 0.5 mg/dL (ref 0.3–1.2)
Total Protein: 5.2 g/dL — ABNORMAL LOW (ref 6.5–8.1)

## 2019-11-09 MED ORDER — NIFEDIPINE ER OSMOTIC RELEASE 30 MG PO TB24
30.0000 mg | ORAL_TABLET | Freq: Two times a day (BID) | ORAL | Status: DC
  Administered 2019-11-09: 30 mg via ORAL
  Filled 2019-11-09: qty 1

## 2019-11-09 MED ORDER — LABETALOL HCL 200 MG PO TABS
600.0000 mg | ORAL_TABLET | Freq: Three times a day (TID) | ORAL | Status: DC
  Administered 2019-11-09 – 2019-11-16 (×21): 600 mg via ORAL
  Filled 2019-11-09 (×20): qty 3
  Filled 2019-11-09: qty 6

## 2019-11-09 MED ORDER — CYCLOBENZAPRINE HCL 10 MG PO TABS
10.0000 mg | ORAL_TABLET | Freq: Three times a day (TID) | ORAL | Status: DC | PRN
  Administered 2019-11-09 – 2019-11-17 (×13): 10 mg via ORAL
  Filled 2019-11-09 (×13): qty 1

## 2019-11-09 MED ORDER — NIFEDIPINE ER OSMOTIC RELEASE 30 MG PO TB24
60.0000 mg | ORAL_TABLET | Freq: Two times a day (BID) | ORAL | Status: DC
  Administered 2019-11-09 – 2019-11-14 (×11): 60 mg via ORAL
  Filled 2019-11-09 (×11): qty 2

## 2019-11-09 MED ORDER — SALINE SPRAY 0.65 % NA SOLN
2.0000 | NASAL | Status: DC | PRN
  Administered 2019-11-09: 2 via NASAL
  Filled 2019-11-09: qty 44

## 2019-11-09 NOTE — Progress Notes (Signed)
   11/09/19 1956  Vital Signs  BP 140/80  BP Location Right Arm  Patient Position (if appropriate) Supine  BP Method Automatic  Pulse Rate 80  POSS Scale (Pasero Opioid Sedation Scale)  POSS *See Group Information* 1-Acceptable,Awake and alert  Oxygen Therapy  SpO2 100 %  O2 Device Room Air   B/p after Hydralazine 10mg  IV.

## 2019-11-09 NOTE — Plan of Care (Signed)
  Problem: Activity: Goal: Risk for activity intolerance will decrease Outcome: Completed/Met   Problem: Elimination: Goal: Will not experience complications related to urinary retention Outcome: Completed/Met   

## 2019-11-09 NOTE — Progress Notes (Signed)
   11/09/19 1939  Vital Signs  BP (!) 174/93  BP Location Right Arm  Patient Position (if appropriate) Supine  BP Method Automatic  Pulse Rate 69  POSS Scale (Pasero Opioid Sedation Scale)  POSS *See Group Information* 1-Acceptable,Awake and alert  Oxygen Therapy  SpO2 100 %  O2 Device Room Air

## 2019-11-09 NOTE — Progress Notes (Signed)
[place]) NOTE  Erica Atkins is a 39 y.o. G3P0020 at [redacted]w[redacted]d by best clinical estimate who is admitted for [facility] with [place].   Fetal presentation is cephalic. Length of Stay:  4  Days  Subjective: Pt reports mild h/a rated a 5/10, ,was a h/a of 8/10 yesterday. Patient reports the fetal movement as active. Patient reports uterine contraction  activity as none. Patient reports  vaginal bleeding as none. Patient describes fluid per vagina as None.  Vitals:  Blood pressure (!) 158/87, pulse 73, temperature 98.3 F (36.8 C), temperature source Oral, resp. rate 17, height 5\' 5"  (1.651 m), weight 81.6 kg, last menstrual period 05/06/2019, SpO2 99 %, unknown if currently breastfeeding. Physical Examination:  General appearance - alert, well appearing, and in no distress, oriented to person, place, and time, playful, active and well hydrated Heart - normal rate and regular rhythm Abdomen - soft, nontender, nondistended Fundal Height:  size equals dates Cervical Exam: Not evaluated. and found to be not evaluated/ / and fetal presentation is . Extremities: extremities normal, atraumatic, no cyanosis or edema and Homans sign is negative, no sign of DVT with DTRs 2+ bilaterally Membranes:intact   fhr CAT I TODAYLabs:  Results for orders placed or performed during the hospital encounter of 11/05/19 (from the past 24 hour(s))  CBC   Collection Time: 11/08/19 10:44 AM  Result Value Ref Range   WBC 10.3 4.0 - 10.5 K/uL   RBC 3.65 (L) 3.87 - 5.11 MIL/uL   Hemoglobin 11.3 (L) 12.0 - 15.0 g/dL   HCT 34.1 (L) 36 - 46 %   MCV 93.4 80.0 - 100.0 fL   MCH 31.0 26.0 - 34.0 pg   MCHC 33.1 30.0 - 36.0 g/dL   RDW 12.7 11.5 - 15.5 %   Platelets 179 150 - 400 K/uL   nRBC 0.0 0.0 - 0.2 %  Comprehensive metabolic panel   Collection Time: 11/08/19 10:44 AM  Result Value Ref Range   Sodium 136 135 - 145 mmol/L   Potassium 3.9 3.5 - 5.1 mmol/L   Chloride 107 98 - 111 mmol/L    CO2 19 (L) 22 - 32 mmol/L   Glucose, Bld 117 (H) 70 - 99 mg/dL   BUN 13 6 - 20 mg/dL   Creatinine, Ser 0.55 0.44 - 1.00 mg/dL   Calcium 7.9 (L) 8.9 - 10.3 mg/dL   Total Protein 5.3 (L) 6.5 - 8.1 g/dL   Albumin 2.7 (L) 3.5 - 5.0 g/dL   AST 57 (H) 15 - 41 U/L   ALT 59 (H) 0 - 44 U/L   Alkaline Phosphatase 63 38 - 126 U/L   Total Bilirubin 0.5 0.3 - 1.2 mg/dL   GFR calc non Af Amer >60 >60 mL/min   GFR calc Af Amer >60 >60 mL/min   Anion gap 10 5 - 15  CBC with Differential/Platelet   Collection Time: 11/09/19  3:34 AM  Result Value Ref Range   WBC 9.1 4.0 - 10.5 K/uL   RBC 3.78 (L) 3.87 - 5.11 MIL/uL   Hemoglobin 12.0 12.0 - 15.0 g/dL   HCT 35.6 (L) 36 - 46 %   MCV 94.2 80.0 - 100.0 fL   MCH 31.7 26.0 - 34.0 pg   MCHC 33.7 30.0 - 36.0 g/dL   RDW 12.5 11.5 - 15.5 %   Platelets 178 150 - 400 K/uL   nRBC 0.0 0.0 - 0.2 %   Neutrophils Relative % 71 %   Neutro Abs  6.5 1.7 - 7.7 K/uL   Lymphocytes Relative 18 %   Lymphs Abs 1.6 0.7 - 4.0 K/uL   Monocytes Relative 10 %   Monocytes Absolute 0.9 0 - 1 K/uL   Eosinophils Relative 1 %   Eosinophils Absolute 0.1 0 - 0 K/uL   Basophils Relative 0 %   Basophils Absolute 0.0 0 - 0 K/uL   Immature Granulocytes 0 %   Abs Immature Granulocytes 0.03 0.00 - 0.07 K/uL  Comprehensive metabolic panel   Collection Time: 11/09/19  3:34 AM  Result Value Ref Range   Sodium 138 135 - 145 mmol/L   Potassium 4.3 3.5 - 5.1 mmol/L   Chloride 108 98 - 111 mmol/L   CO2 20 (L) 22 - 32 mmol/L   Glucose, Bld 74 70 - 99 mg/dL   BUN 11 6 - 20 mg/dL   Creatinine, Ser 0.54 0.44 - 1.00 mg/dL   Calcium 8.1 (L) 8.9 - 10.3 mg/dL   Total Protein 5.2 (L) 6.5 - 8.1 g/dL   Albumin 2.6 (L) 3.5 - 5.0 g/dL   AST 55 (H) 15 - 41 U/L   ALT 66 (H) 0 - 44 U/L   Alkaline Phosphatase 63 38 - 126 U/L   Total Bilirubin 0.5 0.3 - 1.2 mg/dL   GFR calc non Af Amer >60 >60 mL/min   GFR calc Af Amer >60 >60 mL/min   Anion gap 10 5 - 15    Imaging Studies:    Narrative &  Impression  ----------------------------------------------------------------------  OBSTETRICS REPORT                       (Signed Final 11/06/2019 04:44 pm) ---------------------------------------------------------------------- Patient Info  ID #:       [id number]                          D.O.B.:  02/23/1981 (39 yrs)  Name:       Barron Schmid                     Visit Date: 11/06/2019 11:07 am ---------------------------------------------------------------------- Performed By  Attending:        Johnell Comings MD         Ref. Address:     [place]  Performed By:     Jeanene Erb BS,  [facility]                    [place]  Referred By:      Truett Mainland                    MD ---------------------------------------------------------------------- Orders  #  Description                           Code        Ordered By  1  [place] MFM OB DETAIL +14 WK               76811.01    JACOB STINSON ----------------------------------------------------------------------  #  Order #                     Accession #                Episode #  1  [id number]                   [id number]                 [id number] ---------------------------------------------------------------------- Indications  Hypertension - Chronic with superimposed       O11.9 O10.919  preeclampsia (Labetalol)  Abnormal biochemical screen (XXY               O28.9  NIPS)(Klienfelters Syndrome)  Poor obstetrical history (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)  [redacted] weeks gestation of pregnancy                Z3A.26  Encounter for antenatal screening for          Z36.3  malformations  Advanced maternal age multigravida 84+,        O40.522  second  trimester (60 yrs)  Rh negative state in antepartum                O36.0190 ---------------------------------------------------------------------- Vital Signs  Weight (lb): 180                               Height:        5'5"  BMI:         29.95 ---------------------------------------------------------------------- Fetal Evaluation  Num Of Fetuses:         1  Fetal Heart Rate(bpm):  125  Cardiac Activity:       Observed  Presentation:           Cephalic  Placenta:               Anterior  P. Cord Insertion:      Visualized  Amniotic Fluid  AFI FV:      Within normal limits                              Largest Pocket(cm)  5.5 ---------------------------------------------------------------------- Biometry  BPD:      67.8  mm     G. Age:  27w 2d         74  %    CI:        78.01   %    70 - 86                                                          FL/HC:      20.4   %    18.6 - 20.4  HC:      242.9  mm     G. Age:  26w 3d         27  %    HC/AC:      1.07        1.04 - 1.22  AC:      227.1  mm     G. Age:  27w 1d         67  %    FL/BPD:     73.2   %    71 - 87  FL:       49.6  mm     G. Age:  26w 5d         49  %    FL/AC:      21.8   %    20 - 24  HUM:      45.4  mm     G. Age:  26w 6d         59  %  Est. FW:    1000  gm      2 lb 3 oz     65  % ---------------------------------------------------------------------- OB History  Gravidity:    3         Term:   0        Prem:   0        SAB:   0  TOP:          2       Ectopic:  0        Living: 0 ---------------------------------------------------------------------- Gestational Age  LMP:           26w 2d        Date:  05/06/19                 EDD:   02/10/20  U/S Today:     26w 6d                                        EDD:   02/06/20  Best:          26w 2d     Det. By:  LMP  (05/06/19)          EDD:   02/10/20 ---------------------------------------------------------------------- Anatomy  Cranium:                Appears normal         Aortic Arch:            Not well visualized  Cavum:  Not well visualized    Ductal Arch:            Not well visualized  Ventricles:            Appears normal         Diaphragm:              Appears normal  Choroid Plexus:        Appears normal         Stomach:                Appears normal, left                                                                        sided  Cerebellum:            Not well visualized    Abdomen:                Appears normal  Posterior Fossa:       Not well visualized    Abdominal Wall:         Appears nml (cord                                                                        insert, abd wall)  Nuchal Fold:           Not well visualized    Cord Vessels:           Appears normal (3                                                                        vessel cord)  Face:                  Orbits appear          Kidneys:                Appear normal                         normal  Lips:                  Appears normal         Bladder:                Appears normal  Thoracic:              Appears normal         Spine:                  Not well visualized  Heart:  Not well visualized    Upper Extremities:      Appears normal  RVOT:                  Not well visualized    Lower Extremities:      Appears normal  LVOT:                  Not well visualized  Other:  Left Heel visualized. Technically difficult due to fetal position. ---------------------------------------------------------------------- Cervix Uterus Adnexa  Cervix  Not visualized (advanced GA >24wks) ---------------------------------------------------------------------- Comments  Barron Schmid was seen for a follow-up consultation due to  severe range blood pressures and a persistent headache.  The patient was seen at [facility] a few  days ago by Dr. Donalee Citrin.  She was transferred from  [place] to [facility]  last night as she had a  severe headache along with severe range blood pressures  that required treatment with IV labetalol.  She was transferred  due to her early gestational age so that her baby could get  the appropriate level of NICU care should she require  delivery.  The patient had been placed on magnesium sulfate  for maternal seizure prophylaxis and received the first dose of  Celestone prior to being transferred.  Since being admitted to [facility], the patient has been  treated with labetalol 400 mg 3 times a day and she has been  continued on magnesium sulfate for maternal seizure  prophylaxis.  The patient continues to complain of a  headache primarily pain behind her eyes.  Her blood  pressures overnight were in the 130s to 140s over 80s to  90s.  Her [place] labs on admission were within normal limits.  She currently has a 24-hour urine being collected.  Her P/C  ratio at [place] did not indicate significant proteinuria.  Her  deep tendon reflexes are 3+ bilaterally.  There were no signs  of clonus noted today.  On an ultrasound exam performed today, the overall EFW  measures appropriate for her gestational age.  There was  normal amniotic fluid noted.  As this was a bedside  ultrasound, the views of the fetal anatomy were limited.  Her  NST has been reassuring for her gestational age.  The patient was advised that her continued headache along  with her brisk reflexes are concerning signs that may indicate  that she may be developing severe preeclampsia.  At her current gestational age (49 weeks 2 days), we will try  to do everything possible to delay delivery so that she can  reach a more optimal gestational age.  She should continue magnesium sulfate for maternal seizure  prophylaxis until tomorrow and should receive the second  dose of Celestone later this evening.  Her blood pressures should be controlled using labetalol as  prescribed.  Should her blood pressures  continue to increase,  the dosage of labetalol may be increased and her Procardia  may be restarted.  Due to her continued headaches and elevated blood  pressures, I would recommend that she remain in the hospital  for close observation.  Should her 24-hour urine indicate  significant proteinuria (greater than 300 mg), I would  recommend that she remain in the hospital until delivery.  She should have [place] labs evaluated frequently (daily should  her blood pressures remain elevated or 2-3 times a week  should her blood pressures be less than 140/90).  Should she require delivery before 32  weeks, magnesium  sulfate would be indicated for fetal neuroprotection prior to  delivery.  She should receive a rescue course of antenatal  corticosteroids should she require delivery at 34 weeks or  less and it has been 7 or more days since she received the  initial course.  She should have weekly biophysical profiles and amniotic  fluid checks.  Another growth ultrasound should be scheduled  in 3 weeks should she remain undelivered.  The patient understands that delivery is the only treatment for  preeclampsia.  Hopefully, we will be able to delay delivery  until she reaches a more optimal gestational age (61-32  weeks or greater).  The indications for immediate delivery would be:  -If her [place] labs show any abnormalities.  -Should her blood pressures be persistently greater than  160s over 100s despite medication treatment.  -At any time for nonreassuring fetal status.  -Should her headaches worsen or should she develop any  other signs and symptoms of severe preeclampsia. ----------------------------------------------------------------------                   Johnell Comings, MD Electronically Signed Final Report   11/06/2019 04:44 pm -------------------------------------------------------    Currently EPIC will not allow sonographic studies to automatically populate into notes.  In the meantime,  copy and paste results into note or free text.  Medications:  Scheduled . docusate sodium  100 mg Oral Daily  . famotidine  20 mg Oral Daily  . fluticasone  2 spray Each Nare Daily  . labetalol  600 mg Oral Q8H  . NIFEdipine  30 mg Oral BID  . prenatal multivitamin  1 tablet Oral Q1200   I have reviewed the patient's current medications.WILL ADJUST ANTIHYPERTENSIVE TO LABETALOL 600 TID, AND INCREASE pROCARDIA TO BID AT 30 MG/DOSE TODAY.  ASSESSMENT: Patient Active Problem List   Diagnosis Date Noted  . Chronic hypertension with superimposed preeclampsia 11/06/2019  . Headache in pregnancy, antepartum, second trimester   . Chronic hypertension affecting pregnancy 11/04/2019  . Elevated blood pressure affecting pregnancy in third trimester, antepartum 11/03/2019  . Elevated blood pressure affecting pregnancy in first trimester, antepartum 09/04/2019  . Aneuploidy of fetus affecting management of mother in singleton pregnancy 07/26/2019  . Supervision of high risk pregnancy, antepartum 07/05/2019  . Missed abortion 12/13/2018  . Advanced maternal age in multigravida   . Abnormal Pap smear of cervix 11/27/2018  . Rh negative status during pregnancy 11/22/2018  . Attention deficit hyperactivity disorder (ADHD) 11/21/2018  . Depression 11/21/2018  . Prenatal care, subsequent pregnancy, first trimester 11/20/2018  . Trapezius muscle spasm 01/07/2014  . Chronic pain syndrome 07/02/2013  . Thyroid nodule, uninodular 06/20/2013    PLAN: D/C MAG SULFATE PER dR sHANKAR INCREASE LABETALOL AND PROCARDIA CONTINUED INPT MONITORING  HAS F/U bpp Monday.  Jonnie Kind 11/09/2019,9:40 AM

## 2019-11-09 NOTE — Progress Notes (Signed)
   11/09/19 1924  Vital Signs  BP (!) 184/103  BP Location Right Arm  Patient Position (if appropriate) Supine  BP Method Automatic  Pulse Rate 69  Pulse Rate Source Monitor  Resp 20  Temp 98 F (36.7 C)  Temp Source Oral  POSS Scale (Pasero Opioid Sedation Scale)  POSS *See Group Information* 1-Acceptable,Awake and alert  Oxygen Therapy  SpO2 99 %  O2 Device Room Air

## 2019-11-10 ENCOUNTER — Encounter (HOSPITAL_COMMUNITY): Payer: Self-pay | Admitting: Family Medicine

## 2019-11-10 DIAGNOSIS — O10912 Unspecified pre-existing hypertension complicating pregnancy, second trimester: Secondary | ICD-10-CM

## 2019-11-10 DIAGNOSIS — O112 Pre-existing hypertension with pre-eclampsia, second trimester: Secondary | ICD-10-CM

## 2019-11-10 DIAGNOSIS — Z6791 Unspecified blood type, Rh negative: Secondary | ICD-10-CM

## 2019-11-10 LAB — TYPE AND SCREEN
ABO/RH(D): B NEG
Antibody Screen: POSITIVE

## 2019-11-10 LAB — COMPREHENSIVE METABOLIC PANEL
ALT: 54 U/L — ABNORMAL HIGH (ref 0–44)
AST: 34 U/L (ref 15–41)
Albumin: 2.5 g/dL — ABNORMAL LOW (ref 3.5–5.0)
Alkaline Phosphatase: 70 U/L (ref 38–126)
Anion gap: 10 (ref 5–15)
BUN: 13 mg/dL (ref 6–20)
CO2: 20 mmol/L — ABNORMAL LOW (ref 22–32)
Calcium: 8.6 mg/dL — ABNORMAL LOW (ref 8.9–10.3)
Chloride: 105 mmol/L (ref 98–111)
Creatinine, Ser: 0.43 mg/dL — ABNORMAL LOW (ref 0.44–1.00)
GFR calc Af Amer: >60 mL/min (ref >60)
GFR calc non Af Amer: >60 mL/min (ref >60)
Glucose, Bld: 87 mg/dL (ref 70–99)
Potassium: 3.9 mmol/L (ref 3.5–5.1)
Sodium: 135 mmol/L (ref 135–145)
Total Bilirubin: 0.3 mg/dL (ref 0.3–1.2)
Total Protein: 5.2 g/dL — ABNORMAL LOW (ref 6.5–8.1)

## 2019-11-10 LAB — CBC
HCT: 34.4 % — ABNORMAL LOW (ref 36.0–46.0)
Hemoglobin: 11.7 g/dL — ABNORMAL LOW (ref 12.0–15.0)
MCH: 31.1 pg (ref 26.0–34.0)
MCHC: 34 g/dL (ref 30.0–36.0)
MCV: 91.5 fL (ref 80.0–100.0)
Platelets: 172 10*3/uL (ref 150–400)
RBC: 3.76 MIL/uL — ABNORMAL LOW (ref 3.87–5.11)
RDW: 12.2 % (ref 11.5–15.5)
WBC: 10.3 10*3/uL (ref 4.0–10.5)
nRBC: 0 % (ref 0.0–0.2)

## 2019-11-10 MED ORDER — CITRANATAL HARMONY 27-1-260 MG PO CAPS
1.0000 | ORAL_CAPSULE | Freq: Every day | ORAL | Status: DC
  Administered 2019-11-10 – 2019-11-14 (×5): 1 via ORAL
  Filled 2019-11-10 (×8): qty 1

## 2019-11-10 MED ORDER — LORATADINE 10 MG PO TABS
10.0000 mg | ORAL_TABLET | Freq: Every day | ORAL | Status: DC
  Administered 2019-11-10 – 2019-11-18 (×9): 10 mg via ORAL
  Filled 2019-11-10 (×9): qty 1

## 2019-11-10 MED ORDER — PROBIOTIC DAILY PO CAPS
1.0000 | ORAL_CAPSULE | Freq: Every day | ORAL | Status: DC | PRN
  Administered 2019-11-13 – 2019-11-15 (×2): 1 via ORAL
  Administered 2019-11-16: 3 via ORAL
  Filled 2019-11-10 (×2): qty 3

## 2019-11-10 MED ORDER — OXYCODONE HCL 5 MG PO TABS
10.0000 mg | ORAL_TABLET | Freq: Once | ORAL | Status: AC
  Administered 2019-11-10: 10 mg via ORAL
  Filled 2019-11-10: qty 2

## 2019-11-10 NOTE — Progress Notes (Signed)
Patient ID: Erica Atkins, female   DOB: 09/03/1980, 39 y.o.   MRN: [id number] [place]) NOTE  Erica Atkins is a 39 y.o. G3P0020 at [redacted]w[redacted]d by best clinical estimate who is admitted for preeclampsia with severe features.   Fetal presentation is cephalic. Length of Stay:  5  Days  ASSESSMENT: Active Problems:   Rh negative status during pregnancy   Advanced maternal age in multigravida   Aneuploidy of fetus affecting management of mother in singleton pregnancy   Chronic hypertension affecting pregnancy   Chronic hypertension with superimposed preeclampsia   PLAN: Several severe range blood pressures yesterday improved with escalating blood pressure medication. She is now on 600 mg of labetalol 3 times daily and Procardia XL 60 mg twice daily. Erica Atkins labs are reviewed today and are improved. Her serum creatinine is 0.43, AST is 34 (previously 55), ALT is 54 (previously 66), platelets are 172 (previously 178). She reports her headache is much improved today. She is status post betamethasone, NICU consult. Repeat BPP on Monday She reports new onset of sinus pressure. She is taking Flonase twice daily already without significant improvement. We will add Claritin.  Subjective: As well today. Reports excellent fetal movement. Reports some blurry vision while her blood pressure was up yesterday but this is resolved. Denies right upper quadrant pain. Patient reports the fetal movement as active. Patient reports uterine contraction  activity as none. Patient reports  vaginal bleeding as none. Patient describes fluid per vagina as None.  Vitals:  Blood pressure (!) 141/88, pulse 70, temperature (!) 97.1 F (36.2 C), temperature source Oral, resp. rate 18, height 5\' 5"  (1.651 m), weight 81.6 kg, last menstrual period 05/06/2019, SpO2 99 %, unknown if currently breastfeeding. Physical Examination:  General appearance - alert, well appearing, and in no distress Chest -  normal effort Abdomen - gravid, non-tender Fundal Height:  size equals dates Extremities: Homans sign is negative, no sign of DVT  Membranes:intact  Fetal Monitoring:  Baseline: 120 bpm, Variability: Good {> 6 bpm), Accelerations: Reactive and Decelerations: Absent  Labs:  Results for orders placed or performed during the hospital encounter of 11/05/19 (from the past 24 hour(s))  CBC   Collection Time: 11/10/19  5:25 AM  Result Value Ref Range   WBC 10.3 4.0 - 10.5 K/uL   RBC 3.76 (L) 3.87 - 5.11 MIL/uL   Hemoglobin 11.7 (L) 12.0 - 15.0 g/dL   HCT 34.4 (L) 36 - 46 %   MCV 91.5 80.0 - 100.0 fL   MCH 31.1 26.0 - 34.0 pg   MCHC 34.0 30.0 - 36.0 g/dL   RDW 12.2 11.5 - 15.5 %   Platelets 172 150 - 400 K/uL   nRBC 0.0 0.0 - 0.2 %  Comprehensive metabolic panel   Collection Time: 11/10/19  5:25 AM  Result Value Ref Range   Sodium 135 135 - 145 mmol/L   Potassium 3.9 3.5 - 5.1 mmol/L   Chloride 105 98 - 111 mmol/L   CO2 20 (L) 22 - 32 mmol/L   Glucose, Bld 87 70 - 99 mg/dL   BUN 13 6 - 20 mg/dL   Creatinine, Ser 0.43 (L) 0.44 - 1.00 mg/dL   Calcium 8.6 (L) 8.9 - 10.3 mg/dL   Total Protein 5.2 (L) 6.5 - 8.1 g/dL   Albumin 2.5 (L) 3.5 - 5.0 g/dL   AST 34 15 - 41 U/L   ALT 54 (H) 0 - 44 U/L   Alkaline Phosphatase 70 38 - 126  U/L   Total Bilirubin 0.3 0.3 - 1.2 mg/dL   GFR calc non Af Amer >60 >60 mL/min   GFR calc Af Amer >60 >60 mL/min   Anion gap 10 5 - 15  Type and screen [place]   Collection Time: 11/10/19  5:25 AM  Result Value Ref Range   ABO/RH(D) B NEG    Antibody Screen POS    Sample Expiration      11/13/2019,2359 Performed at [facility], [place] [address] [place], [place] [address]     Medications:  Scheduled . docusate sodium  100 mg Oral Daily  . famotidine  20 mg Oral Daily  . fluticasone  2 spray Each Nare Daily  . labetalol  600 mg Oral Q8H  . loratadine  10 mg Oral Daily  . NIFEdipine  60 mg Oral BID  . prenatal  multivitamin  1 tablet Oral Q1200   I have reviewed the patient's current medications.   Donnamae Jude, MD 11/10/2019,7:33 AM

## 2019-11-10 NOTE — Progress Notes (Signed)
Pt requesting pain medication. Verbal order given from Dr. Ilda Basset for Oxycodone 10mg , once.

## 2019-11-11 ENCOUNTER — Inpatient Hospital Stay (HOSPITAL_BASED_OUTPATIENT_CLINIC_OR_DEPARTMENT_OTHER): Payer: Medicaid Other

## 2019-11-11 DIAGNOSIS — O09522 Supervision of elderly multigravida, second trimester: Secondary | ICD-10-CM

## 2019-11-11 DIAGNOSIS — Z3A27 27 weeks gestation of pregnancy: Secondary | ICD-10-CM

## 2019-11-11 DIAGNOSIS — O09292 Supervision of pregnancy with other poor reproductive or obstetric history, second trimester: Secondary | ICD-10-CM | POA: Diagnosis not present

## 2019-11-11 DIAGNOSIS — O289 Unspecified abnormal findings on antenatal screening of mother: Secondary | ICD-10-CM

## 2019-11-11 DIAGNOSIS — O112 Pre-existing hypertension with pre-eclampsia, second trimester: Secondary | ICD-10-CM

## 2019-11-11 DIAGNOSIS — Z363 Encounter for antenatal screening for malformations: Secondary | ICD-10-CM

## 2019-11-11 DIAGNOSIS — O10912 Unspecified pre-existing hypertension complicating pregnancy, second trimester: Secondary | ICD-10-CM

## 2019-11-11 MED ORDER — SODIUM CHLORIDE 0.9% FLUSH
3.0000 mL | Freq: Two times a day (BID) | INTRAVENOUS | Status: DC
  Administered 2019-11-11 – 2019-11-17 (×8): 3 mL via INTRAVENOUS

## 2019-11-11 NOTE — Plan of Care (Signed)
  Problem: Education: Goal: Knowledge of General Education information will improve Description: Including pain rating scale, medication(s)/side effects and non-pharmacologic comfort measures Outcome: Completed/Met

## 2019-11-11 NOTE — Progress Notes (Signed)
Patient ID: Erica Atkins, female   DOB: 1980-09-16, 39 y.o.   MRN: [id number] [place] ANTEPARTUM COMPREHENSIVE PROGRESS NOTE  Erica Atkins is a 39 y.o. G3P0020 at [redacted]w[redacted]d  who is admitted for [facility].   Fetal presentation is breech, on today's U/S Length of Stay:  6  Days  Subjective: Pt reports feeling better this morning. Denies HA or visual changes. Patient reports good fetal movement.  She reports no uterine contractions, no bleeding and no loss of fluid per vagina.  Vitals:  Blood pressure (!) 152/91, pulse 76, temperature 98 F (36.7 C), temperature source Oral, resp. rate 18, height 5\' 5"  (1.651 m), weight 81.6 kg, last menstrual period 05/06/2019, SpO2 99 %, unknown if currently breastfeeding.   Physical Examination: Lungs clear Heart RRR Abd soft + BS gravid Ext non tender    Fetal Monitoring:  Baseline: 120-130's bpm, moderate variability, no ut ctx  Labs:  No results found for this or any previous visit (from the past 24 hour(s)).  Imaging Studies:    See today's U/S report   Medications:  Scheduled . CitraNatal Harmony  1 capsule Oral Daily  . docusate sodium  100 mg Oral Daily  . famotidine  20 mg Oral Daily  . fluticasone  2 spray Each Nare Daily  . labetalol  600 mg Oral Q8H  . loratadine  10 mg Oral Daily  . NIFEdipine  60 mg Oral BID   I have reviewed the patient's current medications.  ASSESSMENT: IUP 27 weeks CHTN with superimposed PEC Malpresentation, Breech on today's U/S AMA with XXY via NIPS Rh negative  PLAN: Stable. S/P magnesium and BMZ. Maximum dose on 2 antihypertensive medications. If requires additional BP control either IV or oral or for maternal/fetal indications would proceed to delivery. Need for c section d/t to malpresentation reviewed with pt. Pt verbalized understanding.  Continue routine antenatal care.   Chancy Milroy 11/11/2019,10:09 AM

## 2019-11-12 NOTE — Plan of Care (Signed)
  Problem: Education: Goal: Knowledge of disease or condition will improve Outcome: Progressing   

## 2019-11-12 NOTE — Progress Notes (Signed)
Patient ID: Erica Atkins, female   DOB: 1981-01-12, 39 y.o.   MRN: [id number] [place] ANTEPARTUM COMPREHENSIVE PROGRESS NOTE  Erica Atkins is a 39 y.o. G3P0020 at [redacted]w[redacted]d  who is admitted for [facility].   Fetal presentation is breech. Length of Stay:  7  Days  Subjective: Pt reports having a HA earlier but resolved with Flexeril. Denies visual changes. Patient reports good fetal movement.  She reports no uterine contractions, no bleeding and no loss of fluid per vagina.  Vitals:  Blood pressure (!) 151/91, pulse 73, temperature 98.3 F (36.8 C), temperature source Oral, resp. rate 18, height 5\' 5"  (1.651 m), weight 81.6 kg, last menstrual period 05/06/2019, SpO2 100 %, unknown if currently breastfeeding.   Physical Examination: Lungs clear Heart RRR Abd soft, + BS gravid Ext non tender  Fetal Monitoring:  Baseline: 130's bpm, moderate variability, no ut ctx  Labs:  No results found for this or any previous visit (from the past 24 hour(s)).  Imaging Studies:       Medications:  Scheduled . CitraNatal Harmony  1 capsule Oral Daily  . docusate sodium  100 mg Oral Daily  . famotidine  20 mg Oral Daily  . fluticasone  2 spray Each Nare Daily  . labetalol  600 mg Oral Q8H  . loratadine  10 mg Oral Daily  . NIFEdipine  60 mg Oral BID  . sodium chloride flush  3 mL Intravenous Q12H   I have reviewed the patient's current medications.  ASSESSMENT: IUP 27 1/7 weeks CHTN with Superimposed PEC Malpresentation AMA with XXY via NIPS Rh negative  PLAN: BP stable on current regiment. Check labs tomorrow. Delivery for maternal or fetal indications. Continue routine antenatal care.   Erica Atkins 11/12/2019,10:19 AM

## 2019-11-12 NOTE — Progress Notes (Signed)
Chaplain met with Erica Atkins and her husband.  She shared that she learned today that she will have to have a c-section to delivery baby [name] and that she is on the highest does of medications she can receive, so she is being very careful to stay flat to keep her BP as low as she can.  I affirmed her efforts and care for her son and offered support.  We talked about mindfulness techniques to manage stress.  Pt also voices that she loves to be visited in the morning when she's lonely.  Please page as further needs arise.  Donald Prose. Elyn Peers, M.Div. [facility] Chaplain Pager [phone] Office [phone]

## 2019-11-13 LAB — CBC
HCT: 39.2 % (ref 36.0–46.0)
Hemoglobin: 13.1 g/dL (ref 12.0–15.0)
MCH: 30.8 pg (ref 26.0–34.0)
MCHC: 33.4 g/dL (ref 30.0–36.0)
MCV: 92 fL (ref 80.0–100.0)
Platelets: 204 10*3/uL (ref 150–400)
RBC: 4.26 MIL/uL (ref 3.87–5.11)
RDW: 12.1 % (ref 11.5–15.5)
WBC: 9.7 10*3/uL (ref 4.0–10.5)
nRBC: 0 % (ref 0.0–0.2)

## 2019-11-13 LAB — COMPREHENSIVE METABOLIC PANEL
ALT: 43 U/L (ref 0–44)
AST: 31 U/L (ref 15–41)
Albumin: 2.8 g/dL — ABNORMAL LOW (ref 3.5–5.0)
Alkaline Phosphatase: 83 U/L (ref 38–126)
Anion gap: 8 (ref 5–15)
BUN: 8 mg/dL (ref 6–20)
CO2: 22 mmol/L (ref 22–32)
Calcium: 8.7 mg/dL — ABNORMAL LOW (ref 8.9–10.3)
Chloride: 105 mmol/L (ref 98–111)
Creatinine, Ser: 0.43 mg/dL — ABNORMAL LOW (ref 0.44–1.00)
GFR calc Af Amer: >60 mL/min (ref >60)
GFR calc non Af Amer: >60 mL/min (ref >60)
Glucose, Bld: 91 mg/dL (ref 70–99)
Potassium: 4 mmol/L (ref 3.5–5.1)
Sodium: 135 mmol/L (ref 135–145)
Total Bilirubin: 0.5 mg/dL (ref 0.3–1.2)
Total Protein: 6.1 g/dL — ABNORMAL LOW (ref 6.5–8.1)

## 2019-11-13 MED ORDER — LACTATED RINGERS IV BOLUS
1000.0000 mL | Freq: Once | INTRAVENOUS | Status: AC
  Administered 2019-11-13: 1000 mL via INTRAVENOUS

## 2019-11-13 MED ORDER — TERBUTALINE SULFATE 1 MG/ML IJ SOLN
0.2500 mg | Freq: Once | INTRAMUSCULAR | Status: AC
  Administered 2019-11-13: 0.25 mg via SUBCUTANEOUS
  Filled 2019-11-13: qty 1

## 2019-11-13 NOTE — Progress Notes (Signed)
Patient ID: Erica Atkins, female   DOB: 1980-05-14, 39 y.o.   MRN: [id number] [place] ANTEPARTUM COMPREHENSIVE PROGRESS NOTE  Erica Atkins is a 39 y.o. G3P0020 at [redacted]w[redacted]d  who is admitted for [facility].   Fetal presentation is breech. Length of Stay:  8  Days  Subjective: Pt has no complaints this morning. She denies HA or visual changes. Patient reports good fetal movement.  She reports no uterine contractions, no bleeding and no loss of fluid per vagina.  Vitals:  Blood pressure (!) 143/90, pulse 75, temperature 97.7 F (36.5 C), temperature source Oral, resp. rate 18, height 5\' 5"  (1.651 m), weight 81.6 kg, last menstrual period 05/06/2019, SpO2 99 %, unknown if currently breastfeeding.   Physical Examination: Lungs clear Heart RRR Abd soft + BS gravid Ext non tender  Fetal Monitoring:  Baseline: 130's, moderate variability, no ut ctx bpm  Labs:  Results for orders placed or performed during the hospital encounter of 11/05/19 (from the past 24 hour(s))  CBC   Collection Time: 11/13/19  4:12 AM  Result Value Ref Range   WBC 9.7 4.0 - 10.5 K/uL   RBC 4.26 3.87 - 5.11 MIL/uL   Hemoglobin 13.1 12.0 - 15.0 g/dL   HCT 39.2 36 - 46 %   MCV 92.0 80.0 - 100.0 fL   MCH 30.8 26.0 - 34.0 pg   MCHC 33.4 30.0 - 36.0 g/dL   RDW 12.1 11.5 - 15.5 %   Platelets 204 150 - 400 K/uL   nRBC 0.0 0.0 - 0.2 %  Comprehensive metabolic panel   Collection Time: 11/13/19  4:12 AM  Result Value Ref Range   Sodium 135 135 - 145 mmol/L   Potassium 4.0 3.5 - 5.1 mmol/L   Chloride 105 98 - 111 mmol/L   CO2 22 22 - 32 mmol/L   Glucose, Bld 91 70 - 99 mg/dL   BUN 8 6 - 20 mg/dL   Creatinine, Ser 0.43 (L) 0.44 - 1.00 mg/dL   Calcium 8.7 (L) 8.9 - 10.3 mg/dL   Total Protein 6.1 (L) 6.5 - 8.1 g/dL   Albumin 2.8 (L) 3.5 - 5.0 g/dL   AST 31 15 - 41 U/L   ALT 43 0 - 44 U/L   Alkaline Phosphatase 83 38 - 126 U/L   Total Bilirubin 0.5 0.3 - 1.2 mg/dL   GFR calc non Af Amer >60 >60 mL/min   GFR calc Af Amer  >60 >60 mL/min   Anion gap 8 5 - 15    Imaging Studies:    NA   Medications:  Scheduled . CitraNatal Harmony  1 capsule Oral Daily  . docusate sodium  100 mg Oral Daily  . famotidine  20 mg Oral Daily  . fluticasone  2 spray Each Nare Daily  . labetalol  600 mg Oral Q8H  . loratadine  10 mg Oral Daily  . NIFEdipine  60 mg Oral BID  . sodium chloride flush  3 mL Intravenous Q12H   I have reviewed the patient's current medications.  ASSESSMENT: IUP 27 2/7 weeks CHTN with Superimposed PEC Malpresentation AMA with XXY via NIPS Rh negative  PLAN: BP stable on current regiment. HELLP labs normal today. Delivery for maternal/fetal indications.  Continue routine antenatal care.   Chancy Milroy 11/13/2019,9:13 AM

## 2019-11-13 NOTE — Progress Notes (Signed)
Notified Dr. Rip Harbour of pt's ctx. Pt reports on and off cramping, rating the pain a 5 when the ctx come. Pt noted to be contracting about every 2-6 minutes. Received verbal order to give .25 mg of terbutaline and a liter bolus of fluids. Will continue to monitor.

## 2019-11-13 NOTE — Progress Notes (Signed)
Initial Nutrition Assessment  DOCUMENTATION CODES:  Not applicable  INTERVENTION:  Regular diet Pt may order double protein portions and snacks TID if she makes request when ordering meals    NUTRITION DIAGNOSIS:  Increased nutrient needs related to  (pregnancy and fetal growth requirements) as evidenced by  (27 weeks IUP).  GOAL:   Patient will meet greater than or equal to 90% of their needs  MONITOR:   Weight trends  REASON FOR ASSESSMENT:   Antenatal, LOS    ASSESSMENT:   27 2/7 weeks, adm with PEC. wt at initial prenatal visit 72.1 kg, BMI 26.4. weight gain to date 21 lbs   Diet Order:   Diet Order            Diet regular Room service appropriate? Yes; Fluid consistency: Thin  Diet effective now                 EDUCATION NEEDS:   No education needs have been identified at this time  Skin:  Skin Assessment: Reviewed RN Assessment   Height:   Ht Readings from Last 1 Encounters:  11/05/19 5\' 5"  (1.651 m)    Weight:   Wt Readings from Last 1 Encounters:  11/05/19 81.6 kg    Ideal Body Weight:   125 lbs  BMI:  Body mass index is 29.95 kg/m.  Estimated Nutritional Needs:   Kcal:  1900-2100  Protein:  85-95 g  Fluid:  2.2 L    Weyman Rodney M.Fredderick Severance LDN Neonatal Nutrition Support Specialist/RD III

## 2019-11-13 NOTE — Plan of Care (Signed)
  Problem: Education: Goal: Knowledge of disease or condition will improve Outcome: Progressing   

## 2019-11-14 ENCOUNTER — Encounter (HOSPITAL_COMMUNITY)
Admission: EM | Disposition: A | Discharge status: Home / Self Care | Payer: Self-pay | Source: Other Acute Inpatient Hospital | Attending: Family Medicine

## 2019-11-14 ENCOUNTER — Inpatient Hospital Stay (HOSPITAL_COMMUNITY): Payer: Medicaid Other | Admitting: Anesthesiology

## 2019-11-14 ENCOUNTER — Encounter (HOSPITAL_COMMUNITY): Payer: Self-pay | Admitting: Obstetrics & Gynecology

## 2019-11-14 DIAGNOSIS — O1410 Severe pre-eclampsia, unspecified trimester: Secondary | ICD-10-CM

## 2019-11-14 DIAGNOSIS — O119 Pre-existing hypertension with pre-eclampsia, unspecified trimester: Secondary | ICD-10-CM

## 2019-11-14 DIAGNOSIS — O321XX0 Maternal care for breech presentation, not applicable or unspecified: Secondary | ICD-10-CM

## 2019-11-14 DIAGNOSIS — Z3A27 27 weeks gestation of pregnancy: Secondary | ICD-10-CM

## 2019-11-14 HISTORY — PX: CYST EXCISION: SHX5701

## 2019-11-14 LAB — TYPE AND SCREEN
ABO/RH(D): B NEG
Antibody Screen: POSITIVE

## 2019-11-14 LAB — CBC
HCT: 34.1 % — ABNORMAL LOW (ref 36.0–46.0)
Hemoglobin: 11.7 g/dL — ABNORMAL LOW (ref 12.0–15.0)
MCH: 31.5 pg (ref 26.0–34.0)
MCHC: 34.3 g/dL (ref 30.0–36.0)
MCV: 91.9 fL (ref 80.0–100.0)
Platelets: 191 10*3/uL (ref 150–400)
RBC: 3.71 MIL/uL — ABNORMAL LOW (ref 3.87–5.11)
RDW: 11.9 % (ref 11.5–15.5)
WBC: 11 10*3/uL — ABNORMAL HIGH (ref 4.0–10.5)
nRBC: 0 % (ref 0.0–0.2)

## 2019-11-14 SURGERY — Surgical Case
Anesthesia: Spinal

## 2019-11-14 SURGERY — CYST REMOVAL
Anesthesia: Spinal | Laterality: Left

## 2019-11-14 MED ORDER — DIPHENHYDRAMINE HCL 25 MG PO CAPS: 25.0000 mg | ORAL_CAPSULE | ORAL | Status: DC | PRN

## 2019-11-14 MED ORDER — PHENYLEPHRINE HCL-NACL 20-0.9 MG/250ML-% IV SOLN
INTRAVENOUS | Status: DC | PRN
  Administered 2019-11-14: 30 ug/min via INTRAVENOUS

## 2019-11-14 MED ORDER — LACTATED RINGERS IV SOLN: INTRAVENOUS | Status: DC | PRN

## 2019-11-14 MED ORDER — DEXMEDETOMIDINE HCL 200 MCG/2ML IV SOLN
INTRAVENOUS | Status: DC | PRN
  Administered 2019-11-14: 12 ug via INTRAVENOUS

## 2019-11-14 MED ORDER — KETOROLAC TROMETHAMINE 30 MG/ML IJ SOLN
30.0000 mg | Freq: Once | INTRAMUSCULAR | Status: AC | PRN
  Administered 2019-11-14: 30 mg via INTRAVENOUS

## 2019-11-14 MED ORDER — HYDROMORPHONE HCL 1 MG/ML IJ SOLN
INTRAMUSCULAR | Status: AC
  Filled 2019-11-14: qty 0.5

## 2019-11-14 MED ORDER — OXYTOCIN 10 UNIT/ML IJ SOLN
10.0000 [IU] | Freq: Once | INTRAMUSCULAR | Status: AC
  Administered 2019-11-14: 10 [IU]
  Filled 2019-11-14: qty 1

## 2019-11-14 MED ORDER — DIPHENHYDRAMINE HCL 25 MG PO CAPS
25.0000 mg | ORAL_CAPSULE | Freq: Four times a day (QID) | ORAL | Status: DC | PRN
  Administered 2019-11-15: 25 mg via ORAL
  Filled 2019-11-14: qty 1

## 2019-11-14 MED ORDER — ONDANSETRON HCL 4 MG/2ML IJ SOLN: 4.0000 mg | Freq: Three times a day (TID) | INTRAMUSCULAR | Status: DC | PRN

## 2019-11-14 MED ORDER — OXYCODONE HCL 5 MG PO TABS: 5.0000 mg | ORAL_TABLET | Freq: Once | ORAL | Status: DC | PRN

## 2019-11-14 MED ORDER — MORPHINE SULFATE (PF) 0.5 MG/ML IJ SOLN
INTRAMUSCULAR | Status: DC | PRN
  Administered 2019-11-14: .15 mg via INTRATHECAL

## 2019-11-14 MED ORDER — FENTANYL CITRATE (PF) 100 MCG/2ML IJ SOLN
INTRAMUSCULAR | Status: AC
  Filled 2019-11-14: qty 2

## 2019-11-14 MED ORDER — CEFAZOLIN SODIUM-DEXTROSE 2-4 GM/100ML-% IV SOLN
2.0000 g | INTRAVENOUS | Status: AC
  Administered 2019-11-14: 2 g via INTRAVENOUS
  Filled 2019-11-14: qty 100

## 2019-11-14 MED ORDER — COCONUT OIL OIL: 1.0000 "application " | TOPICAL_OIL | Status: DC | PRN

## 2019-11-14 MED ORDER — WITCH HAZEL-GLYCERIN EX PADS: 1.0000 "application " | MEDICATED_PAD | CUTANEOUS | Status: DC | PRN

## 2019-11-14 MED ORDER — MORPHINE SULFATE (PF) 0.5 MG/ML IJ SOLN
INTRAMUSCULAR | Status: DC | PRN
Start: 2019-11-14 — End: 2019-11-14
  Administered 2019-11-14 (×2): 2 mg via EPIDURAL

## 2019-11-14 MED ORDER — LIDOCAINE HCL (CARDIAC) PF 100 MG/5ML IV SOSY
PREFILLED_SYRINGE | INTRAVENOUS | Status: DC | PRN
  Administered 2019-11-14: 20 mg via INTRATRACHEAL

## 2019-11-14 MED ORDER — SIMETHICONE 80 MG PO CHEW
80.0000 mg | CHEWABLE_TABLET | ORAL | Status: DC
  Administered 2019-11-15 – 2019-11-17 (×4): 80 mg via ORAL
  Filled 2019-11-14 (×4): qty 1

## 2019-11-14 MED ORDER — SUCCINYLCHOLINE CHLORIDE 20 MG/ML IJ SOLN
INTRAMUSCULAR | Status: DC | PRN
  Administered 2019-11-14: 80 mg via INTRAVENOUS

## 2019-11-14 MED ORDER — DEXAMETHASONE SODIUM PHOSPHATE 10 MG/ML IJ SOLN
INTRAMUSCULAR | Status: AC
  Filled 2019-11-14: qty 1

## 2019-11-14 MED ORDER — SUGAMMADEX SODIUM 200 MG/2ML IV SOLN
INTRAVENOUS | Status: DC | PRN
  Administered 2019-11-14: 200 mg via INTRAVENOUS

## 2019-11-14 MED ORDER — ROCURONIUM 10MG/ML (10ML) SYRINGE FOR MEDFUSION PUMP - OPTIME
INTRAVENOUS | Status: DC | PRN
  Administered 2019-11-14: 30 mg via INTRAVENOUS

## 2019-11-14 MED ORDER — MIDAZOLAM HCL 2 MG/2ML IJ SOLN
INTRAMUSCULAR | Status: AC
  Filled 2019-11-14: qty 2

## 2019-11-14 MED ORDER — MEPERIDINE HCL 25 MG/ML IJ SOLN: 6.2500 mg | INTRAMUSCULAR | Status: DC | PRN

## 2019-11-14 MED ORDER — OXYCODONE HCL 5 MG/5ML PO SOLN: 5.0000 mg | Freq: Once | ORAL | Status: DC | PRN

## 2019-11-14 MED ORDER — IBUPROFEN 800 MG PO TABS
800.0000 mg | ORAL_TABLET | Freq: Four times a day (QID) | ORAL | Status: DC
  Administered 2019-11-16 – 2019-11-18 (×10): 800 mg via ORAL
  Filled 2019-11-14 (×10): qty 1

## 2019-11-14 MED ORDER — MIDAZOLAM HCL 5 MG/5ML IJ SOLN
INTRAMUSCULAR | Status: DC | PRN
  Administered 2019-11-14: 2 mg via INTRAVENOUS

## 2019-11-14 MED ORDER — NIFEDIPINE 10 MG PO CAPS
20.0000 mg | ORAL_CAPSULE | ORAL | Status: DC | PRN
  Filled 2019-11-14: qty 2

## 2019-11-14 MED ORDER — DEXAMETHASONE SODIUM PHOSPHATE 10 MG/ML IJ SOLN
INTRAMUSCULAR | Status: DC | PRN
  Administered 2019-11-14: 10 mg via INTRAVENOUS

## 2019-11-14 MED ORDER — HYDROMORPHONE HCL 1 MG/ML IJ SOLN
0.2500 mg | INTRAMUSCULAR | Status: DC | PRN
  Administered 2019-11-14 (×2): 0.5 mg via INTRAVENOUS

## 2019-11-14 MED ORDER — PROMETHAZINE HCL 25 MG/ML IJ SOLN
6.2500 mg | INTRAMUSCULAR | Status: DC | PRN
  Administered 2019-11-14: 12.5 mg via INTRAVENOUS

## 2019-11-14 MED ORDER — LIDOCAINE 2% (20 MG/ML) 5 ML SYRINGE
INTRAMUSCULAR | Status: AC
  Filled 2019-11-14: qty 5

## 2019-11-14 MED ORDER — BUPIVACAINE IN DEXTROSE 0.75-8.25 % IT SOLN
INTRATHECAL | Status: DC | PRN
  Administered 2019-11-14: 1.8 mL via INTRATHECAL

## 2019-11-14 MED ORDER — OXYTOCIN-SODIUM CHLORIDE 30-0.9 UT/500ML-% IV SOLN
INTRAVENOUS | Status: DC | PRN
  Administered 2019-11-14: 30 [IU] via INTRAVENOUS

## 2019-11-14 MED ORDER — TRANEXAMIC ACID-NACL 1000-0.7 MG/100ML-% IV SOLN
INTRAVENOUS | Status: DC | PRN
  Administered 2019-11-14: 1000 mg via INTRAVENOUS

## 2019-11-14 MED ORDER — ACETAMINOPHEN 325 MG PO TABS
650.0000 mg | ORAL_TABLET | ORAL | Status: DC | PRN
  Administered 2019-11-16: 650 mg via ORAL
  Filled 2019-11-14: qty 2

## 2019-11-14 MED ORDER — HYDROMORPHONE HCL 2 MG PO TABS: 2.0000 mg | ORAL_TABLET | ORAL | Status: DC | PRN

## 2019-11-14 MED ORDER — KETOROLAC TROMETHAMINE 30 MG/ML IJ SOLN: 30.0000 mg | Freq: Four times a day (QID) | INTRAMUSCULAR | Status: DC | PRN

## 2019-11-14 MED ORDER — MAGNESIUM HYDROXIDE 400 MG/5ML PO SUSP: 30.0000 mL | ORAL | Status: DC | PRN

## 2019-11-14 MED ORDER — TETANUS-DIPHTH-ACELL PERTUSSIS 5-2.5-18.5 LF-MCG/0.5 IM SUSP: 0.5000 mL | Freq: Once | INTRAMUSCULAR | Status: DC

## 2019-11-14 MED ORDER — NALBUPHINE HCL 10 MG/ML IJ SOLN: 5.0000 mg | Freq: Once | INTRAMUSCULAR | Status: DC | PRN

## 2019-11-14 MED ORDER — SODIUM CHLORIDE 0.9 % IV SOLN: 250.0000 mL | INTRAVENOUS | Status: DC | PRN

## 2019-11-14 MED ORDER — STERILE WATER FOR IRRIGATION IR SOLN
Status: DC | PRN
  Administered 2019-11-14: 1

## 2019-11-14 MED ORDER — NALBUPHINE HCL 10 MG/ML IJ SOLN: 5.0000 mg | INTRAMUSCULAR | Status: DC | PRN

## 2019-11-14 MED ORDER — DIPHENHYDRAMINE HCL 50 MG/ML IJ SOLN: 12.5000 mg | INTRAMUSCULAR | Status: DC | PRN

## 2019-11-14 MED ORDER — ZOLPIDEM TARTRATE 5 MG PO TABS
5.0000 mg | ORAL_TABLET | Freq: Every evening | ORAL | Status: DC | PRN
  Administered 2019-11-15: 5 mg via ORAL
  Filled 2019-11-14: qty 1

## 2019-11-14 MED ORDER — SOD CITRATE-CITRIC ACID 500-334 MG/5ML PO SOLN
ORAL | Status: AC
  Filled 2019-11-14: qty 30

## 2019-11-14 MED ORDER — OXYTOCIN-SODIUM CHLORIDE 30-0.9 UT/500ML-% IV SOLN
INTRAVENOUS | Status: AC
  Filled 2019-11-14: qty 500

## 2019-11-14 MED ORDER — PROPOFOL 10 MG/ML IV BOLUS
INTRAVENOUS | Status: DC | PRN
  Administered 2019-11-14: 200 mg via INTRAVENOUS

## 2019-11-14 MED ORDER — KETOROLAC TROMETHAMINE 30 MG/ML IJ SOLN
INTRAMUSCULAR | Status: AC
  Filled 2019-11-14: qty 1

## 2019-11-14 MED ORDER — SUCCINYLCHOLINE CHLORIDE 200 MG/10ML IV SOSY
PREFILLED_SYRINGE | INTRAVENOUS | Status: AC
  Filled 2019-11-14: qty 10

## 2019-11-14 MED ORDER — ROCURONIUM BROMIDE 10 MG/ML (PF) SYRINGE
PREFILLED_SYRINGE | INTRAVENOUS | Status: AC
  Filled 2019-11-14: qty 10

## 2019-11-14 MED ORDER — FENTANYL CITRATE (PF) 100 MCG/2ML IJ SOLN
INTRAMUSCULAR | Status: DC | PRN
  Administered 2019-11-14: 15 ug via INTRATHECAL

## 2019-11-14 MED ORDER — SODIUM CHLORIDE 0.9% FLUSH: 3.0000 mL | INTRAVENOUS | Status: DC | PRN

## 2019-11-14 MED ORDER — SODIUM CHLORIDE 0.9 % IV SOLN: INTRAVENOUS | Status: DC | PRN

## 2019-11-14 MED ORDER — OXYTOCIN 10 UNIT/ML IJ SOLN
INTRAMUSCULAR | Status: AC
  Filled 2019-11-14: qty 1

## 2019-11-14 MED ORDER — MENTHOL 3 MG MT LOZG: 1.0000 | LOZENGE | OROMUCOSAL | Status: DC | PRN

## 2019-11-14 MED ORDER — FENTANYL CITRATE (PF) 100 MCG/2ML IJ SOLN
INTRAMUSCULAR | Status: DC | PRN
  Administered 2019-11-14: 50 ug via INTRAVENOUS
  Administered 2019-11-14: 35 ug via INTRAVENOUS

## 2019-11-14 MED ORDER — OXYTOCIN-SODIUM CHLORIDE 30-0.9 UT/500ML-% IV SOLN: 2.5000 [IU]/h | INTRAVENOUS | Status: AC

## 2019-11-14 MED ORDER — MORPHINE SULFATE (PF) 0.5 MG/ML IJ SOLN
INTRAMUSCULAR | Status: AC
  Filled 2019-11-14: qty 10

## 2019-11-14 MED ORDER — NIFEDIPINE 10 MG PO CAPS
10.0000 mg | ORAL_CAPSULE | ORAL | Status: DC | PRN
  Filled 2019-11-14: qty 1

## 2019-11-14 MED ORDER — MAGNESIUM SULFATE BOLUS VIA INFUSION
4.0000 g | Freq: Once | INTRAVENOUS | Status: AC
  Administered 2019-11-14: 4 g via INTRAVENOUS
  Filled 2019-11-14: qty 1000

## 2019-11-14 MED ORDER — LABETALOL HCL 5 MG/ML IV SOLN: 40.0000 mg | INTRAVENOUS | Status: DC | PRN

## 2019-11-14 MED ORDER — TRANEXAMIC ACID-NACL 1000-0.7 MG/100ML-% IV SOLN
INTRAVENOUS | Status: AC
  Filled 2019-11-14: qty 100

## 2019-11-14 MED ORDER — SENNOSIDES-DOCUSATE SODIUM 8.6-50 MG PO TABS
2.0000 | ORAL_TABLET | ORAL | Status: DC
  Administered 2019-11-15 – 2019-11-17 (×4): 2 via ORAL
  Filled 2019-11-14 (×4): qty 2

## 2019-11-14 MED ORDER — SIMETHICONE 80 MG PO CHEW
80.0000 mg | CHEWABLE_TABLET | Freq: Three times a day (TID) | ORAL | Status: DC
  Administered 2019-11-15 – 2019-11-18 (×11): 80 mg via ORAL
  Filled 2019-11-14 (×11): qty 1

## 2019-11-14 MED ORDER — DIBUCAINE (PERIANAL) 1 % EX OINT: 1.0000 "application " | TOPICAL_OINTMENT | CUTANEOUS | Status: DC | PRN

## 2019-11-14 MED ORDER — HYDROCODONE-ACETAMINOPHEN 5-325 MG PO TABS
1.0000 | ORAL_TABLET | ORAL | Status: DC | PRN
  Administered 2019-11-15 – 2019-11-16 (×2): 1 via ORAL
  Administered 2019-11-16 – 2019-11-17 (×2): 2 via ORAL
  Filled 2019-11-14 (×2): qty 2
  Filled 2019-11-14 (×2): qty 1

## 2019-11-14 MED ORDER — LACTATED RINGERS IV SOLN: INTRAVENOUS | Status: DC

## 2019-11-14 MED ORDER — SCOPOLAMINE 1 MG/3DAYS TD PT72
1.0000 | MEDICATED_PATCH | Freq: Once | TRANSDERMAL | Status: AC
  Administered 2019-11-14: 1.5 mg via TRANSDERMAL
  Filled 2019-11-14: qty 1

## 2019-11-14 MED ORDER — NALOXONE HCL 4 MG/10ML IJ SOLN
1.0000 ug/kg/h | INTRAVENOUS | Status: DC | PRN
  Filled 2019-11-14: qty 5

## 2019-11-14 MED ORDER — MORPHINE SULFATE (PF) 2 MG/ML IV SOLN: 1.0000 mg | INTRAVENOUS | Status: DC | PRN

## 2019-11-14 MED ORDER — SIMETHICONE 80 MG PO CHEW: 80.0000 mg | CHEWABLE_TABLET | ORAL | Status: DC | PRN

## 2019-11-14 MED ORDER — PROMETHAZINE HCL 25 MG/ML IJ SOLN
INTRAMUSCULAR | Status: AC
  Filled 2019-11-14: qty 1

## 2019-11-14 MED ORDER — SODIUM CHLORIDE 0.9 % IV SOLN: INTRAVENOUS | Status: DC

## 2019-11-14 MED ORDER — DEXMEDETOMIDINE (PRECEDEX) IN NS 20 MCG/5ML (4 MCG/ML) IV SYRINGE
PREFILLED_SYRINGE | INTRAVENOUS | Status: AC
  Filled 2019-11-14: qty 5

## 2019-11-14 MED ORDER — ACETAMINOPHEN 500 MG PO TABS
1000.0000 mg | ORAL_TABLET | Freq: Four times a day (QID) | ORAL | Status: AC
  Administered 2019-11-14 – 2019-11-15 (×3): 1000 mg via ORAL
  Filled 2019-11-14 (×4): qty 2

## 2019-11-14 MED ORDER — SODIUM CHLORIDE 0.9% FLUSH
3.0000 mL | Freq: Two times a day (BID) | INTRAVENOUS | Status: DC
  Administered 2019-11-15 – 2019-11-17 (×4): 3 mL via INTRAVENOUS

## 2019-11-14 MED ORDER — MAGNESIUM SULFATE 40 GM/1000ML IV SOLN
INTRAVENOUS | Status: AC
  Filled 2019-11-14: qty 1000

## 2019-11-14 MED ORDER — PROPOFOL 10 MG/ML IV BOLUS
INTRAVENOUS | Status: AC
  Filled 2019-11-14: qty 20

## 2019-11-14 MED ORDER — CEFAZOLIN SODIUM-DEXTROSE 2-4 GM/100ML-% IV SOLN
INTRAVENOUS | Status: AC
  Filled 2019-11-14: qty 100

## 2019-11-14 MED ORDER — KETOROLAC TROMETHAMINE 30 MG/ML IJ SOLN
15.0000 mg | Freq: Four times a day (QID) | INTRAMUSCULAR | Status: AC
  Administered 2019-11-15 (×4): 15 mg via INTRAVENOUS
  Filled 2019-11-14 (×4): qty 1

## 2019-11-14 MED ORDER — MAGNESIUM SULFATE 40 GM/1000ML IV SOLN
2.0000 g/h | INTRAVENOUS | Status: AC
  Administered 2019-11-15: 2 g/h via INTRAVENOUS
  Filled 2019-11-14: qty 1000

## 2019-11-14 MED ORDER — SOD CITRATE-CITRIC ACID 500-334 MG/5ML PO SOLN
30.0000 mL | ORAL | Status: AC
  Administered 2019-11-14: 30 mL via ORAL

## 2019-11-14 MED ORDER — NALOXONE HCL 0.4 MG/ML IJ SOLN: 0.4000 mg | INTRAMUSCULAR | Status: DC | PRN

## 2019-11-14 MED ORDER — PRENATAL MULTIVITAMIN CH: 1.0000 | ORAL_TABLET | Freq: Every day | ORAL | Status: DC

## 2019-11-14 MED ORDER — ONDANSETRON HCL 4 MG/2ML IJ SOLN
INTRAMUSCULAR | Status: DC | PRN
  Administered 2019-11-14: 4 mg via INTRAVENOUS

## 2019-11-14 MED ORDER — ONDANSETRON HCL 4 MG/2ML IJ SOLN
INTRAMUSCULAR | Status: AC
  Filled 2019-11-14: qty 2

## 2019-11-14 SURGICAL SUPPLY — 38 items
APL PRP STRL LF DISP 70% ISPRP (MISCELLANEOUS) ×2
APL SKNCLS STERI-STRIP NONHPOA (GAUZE/BANDAGES/DRESSINGS) ×2
BENZOIN TINCTURE PRP APPL 2/3 (GAUZE/BANDAGES/DRESSINGS) ×3 IMPLANT
CHLORAPREP W/TINT 26 (MISCELLANEOUS) ×3 IMPLANT
CLAMP CORD UMBIL (MISCELLANEOUS) ×3 IMPLANT
CLOSURE STERI STRIP 1/2 X4 (GAUZE/BANDAGES/DRESSINGS) ×3 IMPLANT
CLOTH BEACON ORANGE TIMEOUT ST (SAFETY) ×3 IMPLANT
DRSG OPSITE POSTOP 4X10 (GAUZE/BANDAGES/DRESSINGS) ×3 IMPLANT
ELECT REM PT RETURN 9FT ADLT (ELECTROSURGICAL) ×3
ELECTRODE REM PT RTRN 9FT ADLT (ELECTROSURGICAL) ×2 IMPLANT
EXTRACTOR VACUUM KIWI (MISCELLANEOUS) IMPLANT
EXTRACTOR VACUUM M CUP 4 TUBE (SUCTIONS) IMPLANT
GLOVE BIO SURGEON STRL SZ7.5 (GLOVE) ×3 IMPLANT
GLOVE BIOGEL PI IND STRL 7.0 (GLOVE) ×4 IMPLANT
GLOVE BIOGEL PI INDICATOR 7.0 (GLOVE) ×2
GOWN STRL REUS W/TWL 2XL LVL3 (GOWN DISPOSABLE) ×3 IMPLANT
GOWN STRL REUS W/TWL LRG LVL3 (GOWN DISPOSABLE) ×6 IMPLANT
HEMOSTAT ARISTA ABSORB 3G PWDR (HEMOSTASIS) ×3 IMPLANT
HEMOSTAT SURGICEL 4X8 (HEMOSTASIS) ×3 IMPLANT
KIT ABG SYR 3ML LUER SLIP (SYRINGE) IMPLANT
NEEDLE HYPO 25X5/8 SAFETYGLIDE (NEEDLE) IMPLANT
NS IRRIG 1000ML POUR BTL (IV SOLUTION) ×3 IMPLANT
PACK C SECTION WH (CUSTOM PROCEDURE TRAY) ×3 IMPLANT
PAD OB MATERNITY 4.3X12.25 (PERSONAL CARE ITEMS) ×3 IMPLANT
RETAINER VISCERAL (MISCELLANEOUS) ×3 IMPLANT
RTRCTR C-SECT PINK 25CM LRG (MISCELLANEOUS) ×3 IMPLANT
STRIP CLOSURE SKIN 1/2X4 (GAUZE/BANDAGES/DRESSINGS) IMPLANT
SUT PDS AB 0 CTX 36 PDP370T (SUTURE) IMPLANT
SUT PLAIN 0 NONE (SUTURE) IMPLANT
SUT PLAIN 2 0 (SUTURE)
SUT PLAIN ABS 2-0 CT1 27XMFL (SUTURE) IMPLANT
SUT VIC AB 0 CT1 36 (SUTURE) ×9 IMPLANT
SUT VIC AB 2-0 CT1 27 (SUTURE) ×6
SUT VIC AB 2-0 CT1 TAPERPNT 27 (SUTURE) ×4 IMPLANT
SUT VIC AB 4-0 KS 27 (SUTURE) ×3 IMPLANT
TOWEL OR 17X24 6PK STRL BLUE (TOWEL DISPOSABLE) ×6 IMPLANT
TRAY FOLEY W/BAG SLVR 14FR LF (SET/KITS/TRAYS/PACK) IMPLANT
WATER STERILE IRR 1000ML POUR (IV SOLUTION) ×3 IMPLANT

## 2019-11-14 NOTE — Op Note (Signed)
Erica Atkins PROCEDURE DATE: 11/14/2019  PREOPERATIVE DIAGNOSES: Intrauterine pregnancy at [redacted]w[redacted]d weeks gestation; severe preeclampsia and breech presentation  POSTOPERATIVE DIAGNOSES: The same  PROCEDURE: Primary Low Transverse Cesarean Section  SURGEON:  Dr. Juanna Cao, MD  ASSISTANT:  Bennetta Laos, MD  ANESTHESIOLOGY TEAM: Anesthesiologist: Pervis Hocking, DO CRNA: Asher Muir, CRNA  INDICATIONS: Erica Atkins is a 39 y.o. [phone] at [redacted]w[redacted]d here for cesarean section secondary to the indications listed under preoperative diagnoses; please see preoperative note for further details.  The risks of surgery were discussed with the patient including but were not limited to: bleeding which may require transfusion or reoperation; infection which may require antibiotics; injury to bowel, bladder, ureters or other surrounding organs; injury to the fetus; need for additional procedures including hysterectomy in the event of a life-threatening hemorrhage; formation of adhesions; placental abnormalities wth subsequent pregnancies; incisional problems; thromboembolic phenomenon and other postoperative/anesthesia complications.  The patient concurred with the proposed plan, giving informed written consent for the procedure.    FINDINGS:  Viable female infant in frank breech presentation.  Apgars pending.  Clear amniotic fluid.  Intact placenta, three vessel cord.  Normal uterus, fallopian tubes and ovaries bilaterally. Cy  ANESTHESIA: Spinal INTRAVENOUS FLUIDS: 1400 ml   ESTIMATED BLOOD LOSS: 800 ml URINE OUTPUT:  600 ml SPECIMENS: Placenta sent to pathology COMPLICATIONS: None immediate  PROCEDURE IN DETAIL:  The patient preoperatively received intravenous antibiotics and had sequential compression devices applied to her lower extremities.  She was then taken to the operating room where spinal anesthesia was administered and was found to be adequate. She was then placed in a dorsal supine  position with a leftward tilt, and prepped and draped in a sterile manner.  A foley catheter was placed into her bladder and attached to constant gravity.  After an adequate timeout was performed, a Pfannenstiel skin incision was made with scalpel and carried through to the underlying layer of fascia. The fascia was incised in the midline, and this incision was extended bilaterally using the Mayo scissors.  Kocher clamps were applied to the superior aspect of the fascial incision and the underlying rectus muscles were dissected off bluntly and sharply.  The rectus muscles were separated in the midline and the peritoneum was entered bluntly. The Alexis self-retaining retractor was introduced into the abdominal cavity.  Attention was turned to the lower uterine segment where a low transverse hysterotomy was made with a scalpel and extended bilaterally bluntly.  The infant was successfully delivered using breech maneuvers, the cord was clamped and cut after one minute, and the infant was handed over to the awaiting neonatology team. Uterine massage was then administered, and the placenta delivered intact with a three-vessel cord. The uterus was then cleared of clots and debris.  The hysterotomy was closed with 0 Vicryl in a running locked fashion, and an imbricating layer was also placed with 0 Vicryl.  Multiple Figure-of-eight 0 Vicryl serosal stitches were placed to help with hemostasis.  The pelvis was cleared of all clot and debris. Hemostasis was confirmed on all surfaces.  The retractor was removed.  There was some bleeding noted and the uterus was exteriorized.  TXA and Intrauterine pitocin were used to improve bleeding and atony, arista was placed over incision.  The uterus was returned to the abdomen and on reinspection, hemostasis was obtained.  The fascia was then closed using 0 Vicryl in a running fashion.  The subcutaneous layer was irrigated, reapproximated with 2-0 plain gut interrupted stitches,  and the  skin was closed with a 4-0 Vicryl subcuticular stitch. The patient tolerated the procedure well. Sponge, instrument and needle counts were correct x 3.  She was taken to the recovery room in stable condition.    Juanna Cao, MD, [place] [facility], [place]

## 2019-11-14 NOTE — Progress Notes (Signed)
MD notified of persistent severe range Bps and has now maxed out on IV labetalol. She was given hydralazine IV in addition. At this point, per MFM recommendations, we should proceed toward delivery.  She was breech presentation on recent U/S, plan for primary C/S.  NICU will be notified. Pt consented, on call to OR.

## 2019-11-14 NOTE — Anesthesia Procedure Notes (Signed)
Spinal  Patient location during procedure: OR Start time: 11/14/2019 3:25 PM End time: 11/14/2019 3:32 PM Staffing Performed: anesthesiologist  Anesthesiologist: Pervis Hocking, DO Preanesthetic Checklist Completed: patient identified, IV checked, risks and benefits discussed, surgical consent, monitors and equipment checked, pre-op evaluation and timeout performed Spinal Block Patient position: sitting Prep: DuraPrep and site prepped and draped Patient monitoring: cardiac monitor, continuous pulse ox and blood pressure Approach: midline Location: L3-4 Injection technique: single-shot Needle Needle type: Pencan  Needle gauge: 24 G Needle length: 9 cm Assessment Sensory level: T6 Additional Notes Functioning IV was confirmed and monitors were applied. Sterile prep and drape, including hand hygiene and sterile gloves were used. The patient was positioned and the spine was prepped. The skin was anesthetized with lidocaine.  Free flow of clear CSF was obtained prior to injecting local anesthetic into the CSF.  The spinal needle aspirated freely following injection.  The needle was carefully withdrawn.  The patient tolerated the procedure well.

## 2019-11-14 NOTE — Progress Notes (Signed)
Patient ID: Erica Atkins, female   DOB: [birth year], 39 y.o.   MRN: [id number] Erica Atkins  [name] is a 39 y.o. G3P0020 at [redacted]w[redacted]d  who is admitted for [facility].   Fetal presentation is breech. Length of Stay:  9  Days  Subjective: Pt has no complaints this morning. Denies HA or visual changes Patient reports good fetal movement.  She reports no uterine contractions, no bleeding and no loss of fluid per vagina.  Vitals:  Blood pressure (!) 153/92, pulse 72, temperature 98.2 F (36.8 C), temperature source Oral, resp. rate 18, height 5\' 5"  (1.651 m), weight 81.6 kg, last menstrual period 05/06/2019, SpO2 99 %, unknown if currently breastfeeding.   Physical Examination: Lungs clear Heart RRR Abd soft + BS gravid Ext non tender  Fetal Monitoring:  Baseline: 130's, moderate variablity, no ut ctx bpm  Labs:  Results for orders placed or performed during the hospital encounter of 11/05/19 (from the past 24 hour(s))  Type and screen [place]   Collection Time: 11/14/19  7:02 AM  Result Value Ref Range   ABO/RH(D) B NEG    Antibody Screen POS    Sample Expiration 11/17/2019,2359    Antibody Identification      PASSIVELY ACQUIRED ANTI-D Performed at [facility], [address] [place], [place] [address]     Imaging Studies:    NA   Medications:  Scheduled . CitraNatal Harmony  1 capsule Oral Daily  . docusate sodium  100 mg Oral Daily  . famotidine  20 mg Oral Daily  . fluticasone  2 spray Each Nare Daily  . labetalol  600 mg Oral Q8H  . loratadine  10 mg Oral Daily  . NIFEdipine  60 mg Oral BID  . sodium chloride flush  3 mL Intravenous Q12H   I have reviewed the patient's current medications.  ASSESSMENT: IUP 27 3/7 weeks CHTN with superimposed PEC Malpresentation AMA with XXY via NIPS Rh negative  PLAN: Stable. No S/Sx of worsening SIPEC. Will check labs on Sat. Delivery for maternal/fetal  indications Continue routine antenatal care.   Erica Atkins 11/14/2019,9:37 AM

## 2019-11-14 NOTE — Anesthesia Preprocedure Evaluation (Signed)
Anesthesia Evaluation  Patient identified by MRN, date of birth, ID band Patient awake  General Assessment Comment:Patient is not NPO, c section is urgent  Reviewed: Allergy & Precautions, NPO status , Patient's Chart, lab work & pertinent test results  Airway Mallampati: II  TM Distance: >3 FB Neck ROM: Full    Dental no notable dental hx. (+) Caps, Dental Advisory Given, Teeth Intact,    Pulmonary neg pulmonary ROS,    Pulmonary exam normal breath sounds clear to auscultation       Cardiovascular hypertension, Normal cardiovascular exam Rhythm:Regular Rate:Normal  Hypertensive to 180s SBP, maxed out on antihypertensives   Neuro/Psych  Headaches, PSYCHIATRIC DISORDERS Depression ADD, hx abuse   GI/Hepatic negative GI ROS, Neg liver ROS,   Endo/Other  negative endocrine ROS  Renal/GU negative Renal ROS  negative genitourinary   Musculoskeletal negative musculoskeletal ROS (+)   Abdominal Normal abdominal exam  (+)   Peds negative pediatric ROS (+)  Hematology negative hematology ROS (+) hct 34.1, plt 191   Anesthesia Other Findings   Reproductive/Obstetrics (+) Pregnancy G3P0 Pre-E with severe range pressures today, fetus 27 weeks                             Anesthesia Physical Anesthesia Plan  ASA: III and emergent  Anesthesia Plan: Spinal   Post-op Pain Management:    Induction:   PONV Risk Score and Plan: 2 and Propofol infusion and TIVA  Airway Management Planned: Natural Airway and Nasal Cannula  Additional Equipment: None  Intra-op Plan:   Post-operative Plan:   Informed Consent: I have reviewed the patients History and Physical, chart, labs and discussed the procedure including the risks, benefits and alternatives for the proposed anesthesia with the patient or authorized representative who has indicated his/her understanding and acceptance.       Plan Discussed  with: CRNA  Anesthesia Plan Comments:         Anesthesia Quick Evaluation

## 2019-11-14 NOTE — Transfer of Care (Signed)
Immediate Anesthesia Transfer of Care Note  Patient: Erica Atkins  Procedure(s) Performed: CESAREAN SECTION CYST REMOVAL (Left )  Patient Location: PACU  Anesthesia Type:General  Level of Consciousness: sedated  Airway & Oxygen Therapy: Patient Spontanous Breathing and Patient connected to nasal cannula oxygen  Post-op Assessment: Report given to RN and Post -op Vital signs reviewed and stable  Post vital signs: Reviewed and stable  Last Vitals:  Vitals Value Taken Time  BP    Temp    Pulse    Resp    SpO2      Last Pain:  Vitals:   11/14/19 1410  TempSrc:   PainSc: 2       Patients Stated Pain Goal: 3 (02/72/53 6644)  Complications: No complications documented.

## 2019-11-14 NOTE — Anesthesia Postprocedure Evaluation (Signed)
Anesthesia Post Note  Patient: Erica Atkins  Procedure(s) Performed: CESAREAN SECTION CYST REMOVAL (Left )     Patient location during evaluation: PACU Anesthesia Type: Spinal and General Level of consciousness: awake and alert, oriented and patient cooperative Pain management: pain level controlled Vital Signs Assessment: post-procedure vital signs reviewed and stable Respiratory status: spontaneous breathing, nonlabored ventilation and respiratory function stable Cardiovascular status: blood pressure returned to baseline and stable Postop Assessment: no apparent nausea or vomiting, spinal receding, no backache and no headache Anesthetic complications: no   No complications documented.  Last Vitals:  Vitals:   11/14/19 1755 11/14/19 1800  BP:  137/89  Pulse: 81 79  Resp: 14 14  Temp:    SpO2: 98% 97%    Last Pain:  Vitals:   11/14/19 1800  TempSrc:   PainSc: [place]

## 2019-11-14 NOTE — Discharge Summary (Signed)
Postpartum Discharge Summary     Patient Name: Erica Atkins DOB: 01-07-1981 MRN: [id number]  Date of admission: 11/05/2019 Delivery date:11/14/2019  Delivering provider: Juanna Cao T  Date of discharge: 11/18/2019  Admitting diagnosis: Chronic hypertension with superimposed preeclampsia [O11.9] Chronic hypertension affecting pregnancy [O10.919] Intrauterine pregnancy: [redacted]w[redacted]d    Secondary diagnosis:  Active Problems:   Rh negative status during pregnancy   Advanced maternal age in multigravida   Aneuploidy of fetus affecting management of mother in singleton pregnancy   Chronic hypertension affecting pregnancy   Chronic hypertension with superimposed preeclampsia   Cesarean delivery delivered  Additional problems: As noted above   Discharge diagnosis: Cesarean delivery delivered, Chronic hypertension with superimposed preeclampsia                                              Post partum procedures:n/a Augmentation: N/A Complications: None  Hospital course: Sceduled C/S   39y.o. yo G3P0121 at 276w3das admitted to the hospital 11/05/2019 for preeclampsia with severe features. On 8/19, decision was made to proceed to Cesarean given persistent severe range blood pressures, maxed out on IV labetalol. Per MFM, pt was given hydralazine and pt was prepped for the OR. NICU was also consulted given need to proceed with a preterm delivery.   Delivery details are as follows:  Membrane Rupture Time/Date: 3:58 PM ,11/14/2019   Delivery Method:C-Section, Low Transverse  Details of operation can be found in separate operative note.  Patient had an uncomplicated postpartum course.  She is ambulating, tolerating a regular diet, passing flatus, and urinating well. Patient is discharged home in stable condition on  11/18/19        Newborn Data: Birth date:11/14/2019  Birth time:3:59 PM  Gender:Female  Living status:Living  Apgars:7 ,8  Weight:900 g     Magnesium Sulfate received: Yes:  Seizure prophylaxis, neuroprotection BMZ received: Yes Rhophylac:No  MMR:N/A T-DaP:needs, will get at office  COVID vaccine: GIVEN 1st dose Flu: No Transfusion:No  Physical exam  Vitals:   11/17/19 1919 11/17/19 2257 11/18/19 0402 11/18/19 0744  BP: (!) 143/87 (!) 138/94 (!) 150/97 (!) 142/93  Pulse: 78 73 76 80  Resp: 20 16 15 18   Temp: 98.3 F (36.8 C) 98 F (36.7 C) 97.9 F (36.6 C) 98.2 F (36.8 C)  TempSrc: Oral Oral Oral Oral  SpO2: 100% 100% 99% 100%  Weight:      Height:       General: alert, cooperative and no distress Lochia: appropriate Uterine Fundus: firm Incision: Healing well with no significant drainage, Dressing is clean, dry, and intact DVT Evaluation: No evidence of DVT seen on physical exam. Labs: Lab Results  Component Value Date   WBC 19.9 (H) 11/15/2019   HGB 12.2 11/15/2019   HCT 35.7 (L) 11/15/2019   MCV 91.3 11/15/2019   PLT 201 11/15/2019   CMP Latest Ref Rng & Units 11/16/2019  Glucose 70 - 99 mg/dL 106(H)  BUN 6 - 20 mg/dL 13  Creatinine 0.44 - 1.00 mg/dL 0.72  Sodium 135 - 145 mmol/L 137  Potassium 3.5 - 5.1 mmol/L 3.9  Chloride 98 - 111 mmol/L 106  CO2 22 - 32 mmol/L 20(L)  Calcium 8.9 - 10.3 mg/dL 7.6(L)  Total Protein 6.5 - 8.1 g/dL 5.4(L)  Total Bilirubin 0.3 - 1.2 mg/dL 0.3  Alkaline Phos 38 - 126  U/L 70  AST 15 - 41 U/L 33  ALT 0 - 44 U/L 30   Edinburgh Score: No flowsheet data found.   After visit meds:  Allergies as of 11/18/2019      Reactions   Duloxetine Palpitations   Lactose Intolerance (gi) Other (See Comments)   Cramps, bloating, can't sleep   Other Shortness Of Breath   Feathers   Pregabalin Other (See Comments)   Irritability   Tizanidine Hcl Nausea Only   .   Amitriptyline Other (See Comments)   unknown   Gabapentin Other (See Comments)   Joint pain, blurred vision   Robaxin [methocarbamol] Other (See Comments)   TIA symptoms   Tramadol Nausea Only   Can take with hydoxyzine      Medication  List    TAKE these medications   enalapril 10 MG tablet Commonly known as: VASOTEC Take 1 tablet (10 mg total) by mouth daily.   HYDROcodone-acetaminophen 5-325 MG tablet Commonly known as: NORCO/VICODIN Take 1-2 tablets by mouth every 4 (four) hours as needed for moderate pain.   ibuprofen 800 MG tablet Commonly known as: ADVIL Take 1 tablet (800 mg total) by mouth every 6 (six) hours.   NIFEdipine 60 MG 24 hr tablet Commonly known as: ADALAT CC Take 1 tablet (60 mg total) by mouth 2 (two) times daily.   prenatal multivitamin Tabs tablet Take 1 tablet by mouth daily at 12 noon.        Discharge home in stable condition Infant Feeding: No evidence of DVT seen on physical exam. Infant Disposition:NICU Discharge instruction: per After Visit Summary and Postpartum booklet. Activity: Advance as tolerated. Pelvic rest for 6 weeks.  Diet: routine diet Future Appointments: Future Appointments  Date Time Provider [place]  11/22/2019  8:20 AM [facility] None  11/22/2019  8:50 AM Will Bonnet, MD [facility] None   Follow up Visit:  [place] [facility] [place] [facility] Schedule an appointment as soon as possible for a visit.   Specialty: Obstetrics and Gynecology Why: Appt on 11/22/19 for BP and incision check 4 weeks for [facility] visit Contact information: [place] [phone]               Please schedule this patient for a In person postpartum visit in 4 weeks with the following provider: any provider. Additional Postpartum F/U:Incision check 1 week and BP check 1 week  High risk pregnancy complicated by: preeclampsia with severe features Delivery mode:  C-Section, Low Transverse  Anticipated Birth Control:  Unsure  11/18/2019 Sloan Leiter, MD

## 2019-11-14 NOTE — Anesthesia Procedure Notes (Addendum)
Procedure Name: Intubation Date/Time: 11/14/2019 4:56 PM Performed by: Asher Muir, CRNA Pre-anesthesia Checklist: Patient identified, Emergency Drugs available, Suction available and Patient being monitored Patient Re-evaluated:Patient Re-evaluated prior to induction Oxygen Delivery Method: Circle system utilized Preoxygenation: Pre-oxygenation with 100% oxygen Induction Type: IV induction and Cricoid Pressure applied Ventilation: Mask ventilation without difficulty Laryngoscope Size: Mac and 3 (By BF ) Grade View: Grade II Tube size: 7.0 mm Number of attempts: 1 Placement Confirmation: ETT inserted through vocal cords under direct vision,  positive ETCO2 and breath sounds checked- equal and bilateral Secured at: 21 cm Tube secured with: Tape Dental Injury: Teeth and Oropharynx as per pre-operative assessment

## 2019-11-14 NOTE — Lactation Note (Signed)
This note was copied from a baby's chart. Lactation Consultation Note  Patient Name: Erica Atkins KYHCW'C Date: 11/14/2019   Spoke with OB specialialty care.  There is no feeding preference on moms chart.  Will see if mom wants to see lactation  Maternal Data    Feeding    LATCH Score                   Interventions    Lactation Tools Discussed/Used     Consult Status      Rollen Sox 11/14/2019, 11:55 PM

## 2019-11-15 ENCOUNTER — Encounter (HOSPITAL_COMMUNITY): Payer: Self-pay | Admitting: Obstetrics & Gynecology

## 2019-11-15 DIAGNOSIS — O1415 Severe pre-eclampsia, complicating the puerperium: Secondary | ICD-10-CM

## 2019-11-15 LAB — CBC
HCT: 35.7 % — ABNORMAL LOW (ref 36.0–46.0)
Hemoglobin: 12.2 g/dL (ref 12.0–15.0)
MCH: 31.2 pg (ref 26.0–34.0)
MCHC: 34.2 g/dL (ref 30.0–36.0)
MCV: 91.3 fL (ref 80.0–100.0)
Platelets: 201 10*3/uL (ref 150–400)
RBC: 3.91 MIL/uL (ref 3.87–5.11)
RDW: 12.1 % (ref 11.5–15.5)
WBC: 19.9 10*3/uL — ABNORMAL HIGH (ref 4.0–10.5)
nRBC: 0 % (ref 0.0–0.2)

## 2019-11-15 LAB — COMPREHENSIVE METABOLIC PANEL
ALT: 31 U/L (ref 0–44)
AST: 35 U/L (ref 15–41)
Albumin: 2.6 g/dL — ABNORMAL LOW (ref 3.5–5.0)
Alkaline Phosphatase: 80 U/L (ref 38–126)
Anion gap: 9 (ref 5–15)
BUN: 13 mg/dL (ref 6–20)
CO2: 21 mmol/L — ABNORMAL LOW (ref 22–32)
Calcium: 7 mg/dL — ABNORMAL LOW (ref 8.9–10.3)
Chloride: 99 mmol/L (ref 98–111)
Creatinine, Ser: 0.47 mg/dL (ref 0.44–1.00)
GFR calc Af Amer: >60 mL/min (ref >60)
GFR calc non Af Amer: >60 mL/min (ref >60)
Glucose, Bld: 92 mg/dL (ref 70–99)
Potassium: 4.2 mmol/L (ref 3.5–5.1)
Sodium: 129 mmol/L — ABNORMAL LOW (ref 135–145)
Total Bilirubin: 0.6 mg/dL (ref 0.3–1.2)
Total Protein: 5.6 g/dL — ABNORMAL LOW (ref 6.5–8.1)

## 2019-11-15 MED ORDER — NIFEDIPINE ER OSMOTIC RELEASE 30 MG PO TB24
30.0000 mg | ORAL_TABLET | Freq: Every day | ORAL | Status: DC
  Administered 2019-11-15 – 2019-11-16 (×2): 30 mg via ORAL
  Filled 2019-11-15 (×2): qty 1

## 2019-11-15 NOTE — Lactation Note (Signed)
This note was copied from a baby's chart. Lactation Consultation Note  Patient Name: Erica Atkins SLPNP'Y Date: 11/15/2019 Reason for consult: Initial assessment;1st time breastfeeding;Primapara;NICU baby;Preterm <34wks  0511 - 8050 - I conducted an initial consult with Erica Atkins. Her support person at this visit was her mother. Erica Atkins expressed appreciation for the consult as she desires to provide breast milk for her baby. Benefits of breast feeding were discussed. She is a P1 of a preterm baby. She reports positive breast changes in pregnancy.  Erica Atkins does not have a pump at home. I recommended that she call her insurance provider ([employer]) to check eligibility. I recommended that Erica Atkins follow up on this prior to discharge to ensure she has a pump prior to discharge. Patient may be eligible for a stork pump.  I reviewed basic pumping information from the NICU booklet, and I reviewed our community resources.   I helped Erica Atkins initiate pumping. Size 24 flanges appeared appropriate at this session. She pumped fully reclined, but I recommended sitting up more as she is feeling able. I reviewed how to disassemble, clean and reassemble her pump parts. Her support person was quite involved.  Baby's name is Erica Atkins.   I recommended pumping 8 times or more a day, and suggested that she could pump every 2-3 hours during the day and every 3-4 hours at night. We discussed what to expect in days 1-3 of pumping.  Maternal Data Formula Feeding for Exclusion: No Has patient been taught Hand Expression?: Yes Does the patient have breastfeeding experience prior to this delivery?: No    Interventions Interventions: Breast feeding basics reviewed;Hand express;DEBP  Lactation Tools Discussed/Used Tools: Pump Breast pump type: Double-Electric Breast Pump Pump Review: Setup, frequency, and cleaning Initiated by:: hl Date initiated:: 11/15/19   Consult Status Consult Status: Follow-up Date:  11/16/19 Follow-up type: In-patient    Lenore Manner 11/15/2019, 8:54 AM

## 2019-11-15 NOTE — Progress Notes (Signed)
POSTPARTUM PROGRESS NOTE  POD #1  Subjective:  Erica Atkins is a 39 y.o. E8D0735 s/p  Primary LTCS at [redacted]w[redacted]d.  She reports she doing well. No acute events overnight. . She denies any problems with ambulating, voiding or po intake. Denies nausea or vomiting. She has  passed flatus. Pain is well controlled.  Lochia is appropriate.  Objective: Blood pressure 135/86, pulse 63, temperature (!) 97.5 F (36.4 C), temperature source Oral, resp. rate 15, height 5\' 5"  (1.651 m), weight 81.6 kg, last menstrual period 05/06/2019, SpO2 96 %, unknown if currently breastfeeding.  Physical Exam:  General: alert, cooperative and no distress Chest: no respiratory distress Heart:regular rate, distal pulses intact Abdomen: soft, nontender,  Uterine Fundus: firm, appropriately tender DVT Evaluation: No calf swelling or tenderness Extremities: minimal edema Skin: warm, dry; incision clean/dry/intact w/ dressing in place  Recent Labs    11/13/19 0412 11/14/19 1437  HGB 13.1 11.7*  HCT 39.2 34.1*    Assessment/Plan: Erica Atkins is a 39 y.o. Q3E1484 s/p primary  at [redacted]w[redacted]d for severe preeclampsia.  POD#1 - Doing welll; pain well controlled. H/H pending  Pt continues magnesium sulfate recovery until 1600             Baby in NICU             Labetalol discontinued             Continue procardia XL 30 mg daily   LOS: 10 days   Erica Shields, MD Faculty attending [facility] 11/15/2019, 6:54 AM

## 2019-11-15 NOTE — Progress Notes (Signed)
I provided listening presence as Erica Atkins shared about her birth experience and about her son.  She is feeling relieved and grateful that he is doing well and was understandably anxious yesterday when he needed a blood transfusion.  She was grateful for the opportunity to process and share.  [place], Bcc Pager, [phone] 4:16 PM

## 2019-11-16 LAB — COMPREHENSIVE METABOLIC PANEL
ALT: 30 U/L (ref 0–44)
AST: 33 U/L (ref 15–41)
Albumin: 2.6 g/dL — ABNORMAL LOW (ref 3.5–5.0)
Alkaline Phosphatase: 70 U/L (ref 38–126)
Anion gap: 11 (ref 5–15)
BUN: 13 mg/dL (ref 6–20)
CO2: 20 mmol/L — ABNORMAL LOW (ref 22–32)
Calcium: 7.6 mg/dL — ABNORMAL LOW (ref 8.9–10.3)
Chloride: 106 mmol/L (ref 98–111)
Creatinine, Ser: 0.72 mg/dL (ref 0.44–1.00)
GFR calc Af Amer: >60 mL/min (ref >60)
GFR calc non Af Amer: >60 mL/min (ref >60)
Glucose, Bld: 106 mg/dL — ABNORMAL HIGH (ref 70–99)
Potassium: 3.9 mmol/L (ref 3.5–5.1)
Sodium: 137 mmol/L (ref 135–145)
Total Bilirubin: 0.3 mg/dL (ref 0.3–1.2)
Total Protein: 5.4 g/dL — ABNORMAL LOW (ref 6.5–8.1)

## 2019-11-16 MED ORDER — NIFEDIPINE ER OSMOTIC RELEASE 30 MG PO TB24
30.0000 mg | ORAL_TABLET | Freq: Two times a day (BID) | ORAL | Status: DC
  Administered 2019-11-16: 30 mg via ORAL
  Filled 2019-11-16: qty 1

## 2019-11-16 NOTE — Progress Notes (Signed)
Subjective: Postpartum Day 2: Cesarean Delivery at 27 3/7 weeks d/t CHTN with [facility] Patient reports some incisional soreness. Pain medication helps. Ambulating and voiding without problems. + Flatus. Tolerating diet  Objective: Vital signs in last 24 hours: Temp:  [97.5 F (36.4 C)-98.3 F (36.8 C)] 98.3 F (36.8 C) (08/21 0433) Pulse Rate:  [61-76] 76 (08/21 0433) Resp:  [16-18] 18 (08/21 0433) BP: (110-128)/(55-77) 110/55 (08/21 0433) SpO2:  [96 %-100 %] 96 % (08/21 0433)  Physical Exam:  General: alert Lochia: appropriate Uterine Fundus: firm Incision: healing well DVT Evaluation: No evidence of DVT seen on physical exam.  Recent Labs    11/14/19 1437 11/15/19 0643  HGB 11.7* 12.2  HCT 34.1* 35.7*    Assessment/Plan: Status post Cesarean section. Doing well postoperatively. BP controlled with Procardia. Continue current care.  Chancy Milroy 11/16/2019, 10:01 AM

## 2019-11-16 NOTE — Lactation Note (Signed)
This note was copied from a baby's chart. Lactation Consultation Note  Patient Name: Erica Atkins ELFYB'O Date: 11/16/2019 Reason for consult: 1st time breastfeeding;Primapara;NICU baby;Follow-up assessment;Infant < 6lbs  Visited with mom of a 83 hours old pre-term NICU female < 2 lbs. Mom is already pumping every 3 hours and reported that she's been getting drops of colostrum; praised her for her efforts. She didn't know she was supposed to save them, she threw them away. Explain to mom that she needs to save any amount of EBM she may get; but that the purpose of pumping this early on is mainly for breast stimulation and not to get volume. Reviewed pumping schedule, lactogenesis II, supply and demand, benefits of premature milk and breastmilk storage guidelines.  Mom told LC that she really wants to BF and she was eager to know when her milk was going to be coming in and how much; LC explained the multiple variables that play a role in lactogenesis due to prematurity, colostrum containers were also provided. She told LC she doesn't have private insurance, she has Medicaid but not WIC; She's planning on applying to it though. LC explained the process of  [facility] loaner in case mom is eligible for the [facility] program.   Feeding plan:  1. Encouraged mom to continue pumpin every 2-3 hours during the day, at least 8 pumping sessions/24 hours 2. Breast massage and hand expression were also encouraged prior pumping 3. Mom will start saving her EBM and take it upstairs to her NICU baby  No support person in mom's room at the time of [facility] consultation. She reported all questions and concerns were answered, she's aware of [place] OP services and will call PRN.   Maternal Data    Feeding    LATCH Score                   Interventions Interventions: Breast feeding basics reviewed;DEBP  Lactation Tools Discussed/Used Tools: Pump Breast pump type: Double-Electric Breast Pump   Consult  Status Consult Status: Follow-up Date: 11/17/19 Follow-up type: In-patient    Royelle Hinchman Francene Boyers 11/16/2019, 8:43 AM

## 2019-11-16 NOTE — Progress Notes (Signed)
error 

## 2019-11-17 ENCOUNTER — Encounter (HOSPITAL_COMMUNITY): Payer: Self-pay | Admitting: Obstetrics & Gynecology

## 2019-11-17 MED ORDER — ENALAPRIL MALEATE 5 MG PO TABS
10.0000 mg | ORAL_TABLET | Freq: Every day | ORAL | Status: DC
  Administered 2019-11-17 – 2019-11-18 (×2): 10 mg via ORAL
  Filled 2019-11-17 (×2): qty 2

## 2019-11-17 MED ORDER — NIFEDIPINE ER OSMOTIC RELEASE 30 MG PO TB24
60.0000 mg | ORAL_TABLET | Freq: Two times a day (BID) | ORAL | Status: DC
  Administered 2019-11-17 – 2019-11-18 (×3): 60 mg via ORAL
  Filled 2019-11-17 (×3): qty 2

## 2019-11-17 NOTE — Plan of Care (Signed)
Has complained of a headache tonight but denies any other PIH symptoms.Bp under severe range at this time.

## 2019-11-17 NOTE — Progress Notes (Signed)
Called first attending MD regarding 2 severe range pressures. Orders to proceed with Labetalol protocol and new order BP medication Enalapril to be given as well. Completed Labetalol protocol order set. Labetalol 20mg , 40mg , 80mg  and Hydralazine 10mg  given total. See flowsheets. BP came down to 135/77. Continue to monitor.

## 2019-11-17 NOTE — Progress Notes (Signed)
Subjective: Postpartum Day 3: Cesarean Delivery Patient reports blurry vision with trying to read this morning. Denies h/a.  Very cooperative and grateful for any care offered Pt notes facial numbness in peri oral area, but no anxiety, no overbreathing.no hand numbness. Has been present for several days..    Objective: Vital signs in last 24 hours: Temp:  [97.9 F (36.6 C)-98.4 F (36.9 C)] 97.9 F (36.6 C) (08/22 0630) Pulse Rate:  [75-94] 84 (08/22 0630) Resp:  [18] 18 (08/22 0630) BP: (126-164)/(71-104) 158/96 (08/22 0630) SpO2:  [97 %-100 %] 98 % (08/22 0630) BP (!) 158/96 (BP Location: Right Arm)   Pulse 84   Temp 98.4 F (36.9 C) (Oral)   Resp 18   Ht 5\' 5"  (1.651 m)   Wt 81.6 kg   LMP 05/06/2019 (Exact Date)   SpO2 100%   Breastfeeding Unknown   BMI 29.95 kg/m   Physical Exam:  General: alert, cooperative and appears stated age 39: appropriate Uterine Fundus: firm Incision: healing well, clean dry intact DVT Evaluation: No evidence of DVT seen on physical exam. Negative Homan's sign. No cords or calf tenderness. Reflexes 2+ no clonus.  Recent Labs    11/14/19 1437 11/15/19 0643  HGB 11.7* 12.2  HCT 34.1* 35.7*    Assessment/Plan: Status post Cesarean section. Postoperative course complicated by vision disturbance, persistent perioral numbness persistent elevation of BP  WILL INCREASE PROCARDIA TO 60 BID, AND REASSESS IN 4 HOURS, PT WANTS TO BE CONSIDERED FOR D/C LATER TODAY.Jonnie Kind 11/17/2019, 7:37 AM

## 2019-11-17 NOTE — Lactation Note (Signed)
This note was copied from a baby's chart. Lactation Consultation Note  Patient Name: Erica Atkins Date: 11/17/2019 Reason for consult: Follow-up assessment;Preterm <34wks;Infant < 6lbs;NICU baby;Primapara  Infant is 27 weeks < 2 lbs 62 hours old Mom has been pumping every 3 hours using DEBP with a 24 flange. She stated she had some trouble with the R breast compared to the L, with more volume coming from the left. Patient's mother in the room and she states her Mom helped her with the flange position and her flow has improved. LC checked flange size and gave some breast massage while Mom pumped with her permission. Mom's breast felt full but not tight, hard or tender.   Education provided to the Mom on how to clean pump parts, massage and hand express. [place] reviewed with Mom the signs, symptoms, treatment and prevention of engorgement.   Mom denies any nipple pain or discomfort at this time with her current 24 flange. Informed Mom that with pumping, her nipples can change and she has a 27 flange in the kit if she finds discomfort with pumping using the 24. Mom nipples were normal with no signs of trauma.  LC assisted Mom to clean pump parts. Mom is now able to add all volumes collected in large bottle. LC placed collected BM in the fridge with the label for the date collected.   Will f/u with RN about getting her EBM to the NICU.   Interventions Interventions: DEBP;Breast massage;Expressed milk;Hand express;Breast compression  Lactation Tools Discussed/Used Tools: Pump Breast pump type: Double-Electric Breast Pump   Consult Status Consult Status: Follow-up Date: 11/17/19 Follow-up type: In-patient    Erica Davoli  Atkins 11/17/2019, 3:33 PM

## 2019-11-18 ENCOUNTER — Inpatient Hospital Stay: Payer: Medicaid Other

## 2019-11-18 ENCOUNTER — Telehealth: Payer: Self-pay | Admitting: Radiology

## 2019-11-18 DIAGNOSIS — Z23 Encounter for immunization: Secondary | ICD-10-CM

## 2019-11-18 LAB — SURGICAL PATHOLOGY

## 2019-11-18 MED ORDER — CYCLOBENZAPRINE HCL 10 MG PO TABS
10.0000 mg | ORAL_TABLET | Freq: Three times a day (TID) | ORAL | 0 refills | Status: DC | PRN
Start: 2019-11-18 — End: 2019-11-20

## 2019-11-18 MED ORDER — NIFEDIPINE ER 60 MG PO TB24: 60.0000 mg | ORAL_TABLET | Freq: Two times a day (BID) | ORAL | 2 refills | Status: AC

## 2019-11-18 MED ORDER — HYDROCODONE-ACETAMINOPHEN 5-325 MG PO TABS: 1.0000 | ORAL_TABLET | ORAL | 0 refills | Status: AC | PRN

## 2019-11-18 MED ORDER — ENALAPRIL MALEATE 10 MG PO TABS
10.0000 mg | ORAL_TABLET | Freq: Every day | ORAL | 2 refills | Status: DC
Start: 2019-11-18 — End: 2019-11-19

## 2019-11-18 MED ORDER — IBUPROFEN 800 MG PO TABS: 800.0000 mg | ORAL_TABLET | Freq: Four times a day (QID) | ORAL | 0 refills | Status: AC

## 2019-11-18 MED FILL — ENALAPRIL MALEATE 10 MG TAB: 10 | 60 days supply | Qty: 60 | Fill #0

## 2019-11-18 MED FILL — CYCLOBENZAPRINE 10 MG TAB: 10 | 10 days supply | Qty: 30 | Fill #0

## 2019-11-18 MED FILL — NIFEdipine ER 60 MG TB24: 60 | 60 days supply | Qty: 120 | Fill #0

## 2019-11-18 MED FILL — IBUPROFEN 800 MG TAB: 800 | 7 days supply | Qty: 30 | Fill #0

## 2019-11-18 MED FILL — HYDROCODON-APAP 5-325: 5-325 | 3 days supply | Qty: 30 | Fill #0

## 2019-11-18 NOTE — Clinical Social Work Maternal (Signed)
CLINICAL SOCIAL WORK MATERNAL/CHILD NOTE  Patient Details  Name: Marija Calamari MRN: [id number] Date of Birth: 02-Apr-1980  Date:  11/18/2019  Clinical Social Worker Initiating Note:  Abundio Miu, [place] Date/Time: Initiated:  11/18/19/1216     Child's Name:  Irene Pap Pengu   Biological Parents:  Mother, Father (Father: Biomedical engineer)   Need for Interpreter:  None   Reason for Referral:  Parental Support of Premature Babies < 32 weeks/or Critically Ill babies   Address:   [place] [address]   Phone number:  [phone] (home)     Additional phone number:   Household Members/Support Persons (HM/SP):   Household Member/Support Person 1   HM/SP Name Relationship DOB or Age  HM/SP -1 Gerold Pengu Husband/FOB    HM/SP -2        HM/SP -3        HM/SP -4        HM/SP -5        HM/SP -6        HM/SP -7        HM/SP -8          Natural Supports (not living in the home):  Immediate Family, Extended Family   Professional Supports: None   Employment: Unemployed   Type of Work:     Education:  Public librarian arranged:    Museum/gallery curator Resources:  Medicaid   Other Resources:  [facility]   Cultural/Religious Considerations Which May Impact Care:    Strengths:  Ability to meet basic needs , Understanding of illness   Psychotropic Medications:         Pediatrician:       Pediatrician List:   [place]      Pediatrician Fax Number:    Risk Factors/Current Problems:  None   Cognitive State:  Able to Concentrate , Alert , Goal Oriented , Insightful , Linear Thinking    Mood/Affect:  Calm , Interested , Comfortable , Relaxed    CSW Assessment: CSW met with MOB at bedside to discuss infant's NICU admission. CSW introduced self and explained reason for visit. MOB was welcoming, pleasant, open and remained engaged during  assessment. MOB reported that she resides with her husband/FOB and is not currently working. MOB shared that she is experiencing financial stressors due to being out of work and FOB not currently working. CSW acknowledged MOB's financial stressors and informed MOB about the [employer] financial assistance program. MOB reported that she is interested in applying, CSW agreed to provide application information and informed MOB that CSW will have to complete a healthcare verification once application is completed, MOB verbalized understanding. MOB reported that she receives [facility] and plans to apply for food stamps. CSW agreed to provide MOB with information on how to apply for food stamps. MOB reported that she has a few things for infant but needs assistance obtaining more items. CSW informed MOB about Family Support Network [employer]. MOB reported that she needs assistance with diapers, wipes, clothes, blankets and pack and play. CSW agreed to make referral. MOB reported that she also needs assistance obtaining a car seat. CSW informed MOB about the hospital car seat program, MOB reported that she is interested. CSW agreed to provide car seat closer to infant's discharge date. CSW inquired about MOB's support  system, MOB reported that her family is supportive. MOB shared that she loss her daughter last September. CSW offered condolences and inquired about how MOB coped with the loss. MOB reported that she received grief counseling which was helpful.   FOB entered the room, CSW introduced self. CSW and parents discussed infant's NICU admission. CSW informed parents about the NICU, what to expect and resources/supports available while infant is admitted to the NICU. MOB reported that she feels well informed about infant's care and that staff have been taking very good care of infant and MOB. MOB reported that staff has been attentive, loving and passionate. MOB reported that meal vouchers  and gas cards would be helpful. CSW agreed to provide. MOB reported that they have one car and gas will be the only issue, noting that gas cards will be very helpful. CSW informed parents that infant qualifies to apply for SSI benefits. MOB reported that they are interested in applying, CSW explained SSI benefits application process and provided paperwork.   CSW asked FOB to leave the room to speak with MOB privately, FOB left the room. CSW inquired about MOB's mental health history. MOB reported that she has a history of depression in her childhood. MOB shared that the depression was situational. MOB denied any current depression and denied any other mental health history. CSW inquired about how MOB was feeling emotionally after giving birth, MOB reported that she was nervous initially and that it has been an emotional roller coaster. MOB shared that her emotions have been up and down due to the pregnancy. CSW acknowledged and validated MOB's feelings. MOB shared that she feels empty in a way because she has not been able to hold infant yet. CSW acknowledged and normalized MOB's feelings surrounding not being able to hold infant yet. CSW encouraged MOB to speak with infant's RN about when MOB can hold infant, MOB agreed. MOB presented calm and was very open. MOB did not demonstrate any acute mental health signs/symptoms. CSW assessed for safety, MOB denied SI, HI and domestic violence.   CSW provided education regarding the baby blues period vs. perinatal mood disorders, discussed treatment and gave resources for mental health follow up if concerns arise.  CSW recommends self-evaluation during the postpartum time period using the New Mom Checklist from Postpartum Progress and encouraged MOB to contact a medical professional if symptoms are noted at any time. MOB was receptive to education provided and reported that she is good about asking for help. CSW praised MOB for seeking help when needed.   CSW provided  review of Sudden Infant Death Syndrome (SIDS) precautions.    CSW will continue to offer resources/supports while infant remains admitted to the NICU.    CSW Plan/Description:  Sudden Infant Death Syndrome (SIDS) Education, Perinatal Mood and Anxiety Disorder (PMADs) Education, Other Patient/Family Education, Other Information/Referral to [employer], [employer] Income (SSI) [place], LCSW 11/18/2019, 12:19 PM

## 2019-11-18 NOTE — Lactation Note (Signed)
This note was copied from a baby's chart. Lactation Consultation Note  Patient Name: Erica Atkins [id number] Date: 11/18/2019 Reason for consult: Follow-up assessment;Mother's request  Mother is a [facility] participant at [facility]. Mother inquires about a loaner pump because she is being discharged today and has not been able to contact the [facility] office to get a DEBP to pump at home. Offered to fax a [place] referral to [facility] and discussed DEBP from [facility] loaner program. Mother completed form, agreed to terms and provided $30 deposit.   Reviewed pumping frequency, cleaning and milk storage. Encouraged to contact [facility] provided Lactation Services brochure for support and recommended to request help for questions, concerns and or supplies when visiting baby at NICU.    All questions answered at this time.   Lactation Tools Discussed/Used Tools: Pump Breast pump type: Double-Electric Breast Pump WIC Program: Yes   Consult Status Consult Status: Follow-up Date: 11/19/19 Follow-up type: In-patient    Jaicey Sweaney A Higuera Ancidey 11/18/2019, 2:06 PM

## 2019-11-18 NOTE — Progress Notes (Signed)
   Covid-19 Vaccination Clinic  Name:  Erica Atkins    MRN: [id number] DOB: October 17, 1980  11/18/2019  Erica Atkins was observed post Covid-19 immunization for 15 minutes without incident. She was provided with Vaccine Information Sheet and instruction to access the V-Safe system.   Erica Atkins was instructed to call 911 with any severe reactions post vaccine: Marland Kitchen Difficulty breathing  . Swelling of face and throat  . A fast heartbeat  . A bad rash all over body  . Dizziness and weakness   Immunizations Administered    Name Date Dose VIS Date Route   Pfizer COVID-19 Vaccine 11/18/2019 12:48 PM 0.3 mL 05/22/2018 Intramuscular   Manufacturer: [employer], [employer]   Lot: [id number]   [place]: [address]-5

## 2019-11-18 NOTE — Telephone Encounter (Signed)
Called patient to scheduled PP BP check and incision check for 11/29/19. Unable to leave voicemail due to inbox being full. Will send mychart message to patient. Patient is actually a patient at [facility] but delivered at [facility].

## 2019-11-18 NOTE — Lactation Note (Signed)
This note was copied from a baby's chart. Lactation Consultation Note  Patient Name: Erica Atkins [id number] Date: 11/18/2019 Reason for consult: Follow-up assessment;Mother's request  Mother is a [facility] participant at [facility]. Mother inquires about a loaner pump because she is being discharged today and has not been able to contact the [facility] office to get a DEBP to pump at home. Offered to fax a [place] referral to [facility] and discussed DEBP from [facility] loaner program. Mother completed form, agreed to terms and provided $30 deposit.   Reviewed pumping frequency, cleaning and milk storage. Encouraged to contact [facility] provided Lactation Services brochure for support and recommended to request help for questions, concerns and or supplies when visiting baby at NICU.    All questions answered at this time.   Lactation Tools Discussed/Used Tools: Pump Breast pump type: Double-Electric Breast Pump WIC Program: Yes   Consult Status Consult Status: Follow-up Date: 11/19/19 Follow-up type: In-patient    Bradon Fester A Higuera Ancidey 11/18/2019, 1:59 PM

## 2019-11-18 NOTE — Progress Notes (Signed)
Discharge instructions and prescriptions given to pt. Discussed post c-section care, signs and symptoms to report to the MD, upcoming appointments, and meds. Pt verbalizes understanding and has no questions or concerns at this time. Pt discharged from hospital in stable condition.

## 2019-11-18 NOTE — Discharge Instructions (Signed)
Preeclampsia and Eclampsia Preeclampsia is a serious condition that may develop during pregnancy. This condition causes high blood pressure and increased protein in your urine along with other symptoms, such as headaches and vision changes. These symptoms may develop as the condition gets worse. Preeclampsia may occur at 20 weeks of pregnancy or later. Diagnosing and treating preeclampsia early is very important. If not treated early, it can cause serious problems for you and your baby. One problem it can lead to is eclampsia. Eclampsia is a condition that causes muscle jerking or shaking (convulsions or seizures) and other serious problems for the mother. During pregnancy, delivering your baby may be the best treatment for preeclampsia or eclampsia. For most women, preeclampsia and eclampsia symptoms go away after giving birth. In rare cases, a woman may develop preeclampsia after giving birth (postpartum preeclampsia). This usually occurs within 48 hours after childbirth but may occur up to 6 weeks after giving birth. What are the causes? The cause of preeclampsia is not known. What increases the risk? The following risk factors make you more likely to develop preeclampsia:  Being pregnant for the first time.  Having had preeclampsia during a past pregnancy.  Having a family history of preeclampsia.  Having high blood pressure.  Being pregnant with more than one baby.  Being 35 or older.  Being African-American.  Having kidney disease or diabetes.  Having medical conditions such as lupus or blood diseases.  Being very overweight (obese). What are the signs or symptoms? The most common symptoms are:  Severe headaches.  Vision problems, such as blurred or double vision.  Abdominal pain, especially upper abdominal pain. Other symptoms that may develop as the condition gets worse include:  Sudden weight gain.  Sudden swelling of the hands, face, legs, and feet.  Severe nausea  and vomiting.  Numbness in the face, arms, legs, and feet.  Dizziness.  Urinating less than usual.  Slurred speech.  Convulsions or seizures. How is this diagnosed? There are no screening tests for preeclampsia. Your health care provider will ask you about symptoms and check for signs of preeclampsia during your prenatal visits. You may also have tests that include:  Checking your blood pressure.  Urine tests to check for protein. Your health care provider will check for this at every prenatal visit.  Blood tests.  Monitoring your baby's heart rate.  Ultrasound. How is this treated? You and your health care provider will determine the treatment approach that is best for you. Treatment may include:  Having more frequent prenatal exams to check for signs of preeclampsia, if you have an increased risk for preeclampsia.  Medicine to lower your blood pressure.  Staying in the hospital, if your condition is severe. There, treatment will focus on controlling your blood pressure and the amount of fluids in your body (fluid retention).  Taking medicine (magnesium sulfate) to prevent seizures. This may be given as an injection or through an IV.  Taking a low-dose aspirin during your pregnancy.  Delivering your baby early. You may have your labor started with medicine (induced), or you may have a cesarean delivery. Follow these instructions at home: Eating and drinking   Drink enough fluid to keep your urine pale yellow.  Avoid caffeine. Lifestyle  Do not use any products that contain nicotine or tobacco, such as cigarettes and e-cigarettes. If you need help quitting, ask your health care provider.  Do not use alcohol or drugs.  Avoid stress as much as possible. Rest and get   plenty of sleep. General instructions  Take over-the-counter and prescription medicines only as told by your health care provider.  When lying down, lie on your left side. This keeps pressure off your  major blood vessels.  When sitting or lying down, raise (elevate) your feet. Try putting some pillows underneath your lower legs.  Exercise regularly. Ask your health care provider what kinds of exercise are best for you.  Keep all follow-up and prenatal visits as told by your health care provider. This is important. How is this prevented? There is no known way of preventing preeclampsia or eclampsia from developing. However, to lower your risk of complications and detect problems early:  Get regular prenatal care. Your health care provider may be able to diagnose and treat the condition early.  Maintain a healthy weight. Ask your health care provider for help managing weight gain during pregnancy.  Work with your health care provider to manage any long-term (chronic) health conditions you have, such as diabetes or kidney problems.  You may have tests of your blood pressure and kidney function after giving birth.  Your health care provider may have you take low-dose aspirin during your next pregnancy. Contact a health care provider if:  You have symptoms that your health care provider told you may require more treatment or monitoring, such as: ? Headaches. ? Nausea or vomiting. ? Abdominal pain. ? Dizziness. ? Light-headedness. Get help right away if:  You have severe: ? Abdominal pain. ? Headaches that do not get better. ? Dizziness. ? Vision problems. ? Confusion. ? Nausea or vomiting.  You have any of the following: ? A seizure. ? Sudden, rapid weight gain. ? Sudden swelling in your hands, ankles, or face. ? Trouble moving any part of your body. ? Numbness in any part of your body. ? Trouble speaking. ? Abnormal bleeding.  You faint. Summary  Preeclampsia is a serious condition that may develop during pregnancy.  This condition causes high blood pressure and increased protein in your urine along with other symptoms, such as headaches and vision  changes.  Diagnosing and treating preeclampsia early is very important. If not treated early, it can cause serious problems for you and your baby.  Get help right away if you have symptoms that your health care provider told you to watch for. This information is not intended to replace advice given to you by your health care provider. Make sure you discuss any questions you have with your health care provider. Document Revised: 11/14/2017 Document Reviewed: 10/19/2015 Elsevier Patient Education  [place]. COVID-19 Vaccine Information can be found at: ShippingScam.co.uk For questions related to vaccine distribution or appointments, please email vaccine@[place] .[email or web address] or call [phone].     Cesarean Delivery, Care After This sheet gives you information about how to care for yourself after your procedure. Your health care provider may also give you more specific instructions. If you have problems or questions, contact your health care provider. What can I expect after the procedure? After the procedure, it is common to have:  A small amount of blood or clear fluid coming from the incision.  Some redness, swelling, and pain in your incision area.  Some abdominal pain and soreness.  Vaginal bleeding (lochia). Even though you did not have a vaginal delivery, you will still have vaginal bleeding and discharge.  Pelvic cramps.  Fatigue. You may have pain, swelling, and discomfort in the tissue between your vagina and your anus (perineum) if:  Your C-section was unplanned, and  you were allowed to labor and push.  An incision was made in the area (episiotomy) or the tissue tore during attempted vaginal delivery. Follow these instructions at home: Incision care   Follow instructions from your health care provider about how to take care of your incision. Make sure you: ? Wash your hands with soap and water before you  change your bandage (dressing). If soap and water are not available, use hand sanitizer. ? If you have a dressing, change it or remove it as told by your health care provider. ? Leave stitches (sutures), skin staples, skin glue, or adhesive strips in place. These skin closures may need to stay in place for 2 weeks or longer. If adhesive strip edges start to loosen and curl up, you may trim the loose edges. Do not remove adhesive strips completely unless your health care provider tells you to do that.  Check your incision area every day for signs of infection. Check for: ? More redness, swelling, or pain. ? More fluid or blood. ? Warmth. ? Pus or a bad smell.  Do not take baths, swim, or use a hot tub until your health care provider says it's okay. Ask your health care provider if you can take showers.  When you cough or sneeze, hug a pillow. This helps with pain and decreases the chance of your incision opening up (dehiscing). Do this until your incision heals. Medicines  Take over-the-counter and prescription medicines only as told by your health care provider.  If you were prescribed an antibiotic medicine, take it as told by your health care provider. Do not stop taking the antibiotic even if you start to feel better.  Do not drive or use heavy machinery while taking prescription pain medicine. Lifestyle  Do not drink alcohol. This is especially important if you are breastfeeding or taking pain medicine.  Do not use any products that contain nicotine or tobacco, such as cigarettes, e-cigarettes, and chewing tobacco. If you need help quitting, ask your health care provider. Eating and drinking  Drink at least 8 eight-ounce glasses of water every day unless told not to by your health care provider. If you breastfeed, you may need to drink even more water.  Eat high-fiber foods every day. These foods may help prevent or relieve constipation. High-fiber foods include: ? Whole grain  cereals and breads. ? Brown rice. ? Beans. ? Fresh fruits and vegetables. Activity   If possible, have someone help you care for your baby and help with household activities for at least a few days after you leave the hospital.  Return to your normal activities as told by your health care provider. Ask your health care provider what activities are safe for you.  Rest as much as possible. Try to rest or take a nap while your baby is sleeping.  Do not lift anything that is heavier than 10 lbs (4.5 kg), or the limit that you were told, until your health care provider says that it is safe.  Talk with your health care provider about when you can engage in sexual activity. This may depend on your: ? Risk of infection. ? How fast you heal. ? Comfort and desire to engage in sexual activity. General instructions  Do not use tampons or douches until your health care provider approves.  Wear loose, comfortable clothing and a supportive and well-fitting bra.  Keep your perineum clean and dry. Wipe from front to back when you use the toilet.  If you  pass a blood clot, save it and call your health care provider to discuss. Do not flush blood clots down the toilet before you get instructions from your health care provider.  Keep all follow-up visits for you and your baby as told by your health care provider. This is important. Contact a health care provider if:  You have: ? A fever. ? Bad-smelling vaginal discharge. ? Pus or a bad smell coming from your incision. ? Difficulty or pain when urinating. ? A sudden increase or decrease in the frequency of your bowel movements. ? More redness, swelling, or pain around your incision. ? More fluid or blood coming from your incision. ? A rash. ? Nausea. ? Little or no interest in activities you used to enjoy. ? Questions about caring for yourself or your baby.  Your incision feels warm to the touch.  Your breasts turn red or become painful or  hard.  You feel unusually sad or worried.  You vomit.  You pass a blood clot from your vagina.  You urinate more than usual.  You are dizzy or light-headed. Get help right away if:  You have: ? Pain that does not go away or get better with medicine. ? Chest pain. ? Difficulty breathing. ? Blurred vision or spots in your vision. ? Thoughts about hurting yourself or your baby. ? New pain in your abdomen or in one of your legs. ? A severe headache.  You faint.  You bleed from your vagina so much that you fill more than one sanitary pad in one hour. Bleeding should not be heavier than your heaviest period. Summary  After the procedure, it is common to have pain at your incision site, abdominal cramping, and slight bleeding from your vagina.  Check your incision area every day for signs of infection.  Tell your health care provider about any unusual symptoms.  Keep all follow-up visits for you and your baby as told by your health care provider. This information is not intended to replace advice given to you by your health care provider. Make sure you discuss any questions you have with your health care provider. Document Revised: 09/20/2017 Document Reviewed: 09/20/2017 Elsevier Patient Education  [place].

## 2019-11-19 ENCOUNTER — Observation Stay (HOSPITAL_COMMUNITY)
Admission: AD | Admit: 2019-11-19 | Discharge: 2019-11-20 | Disposition: A | Discharge status: Home / Self Care | Payer: Medicaid Other | Attending: Family Medicine | Admitting: Family Medicine

## 2019-11-19 ENCOUNTER — Other Ambulatory Visit: Payer: Self-pay

## 2019-11-19 ENCOUNTER — Ambulatory Visit: Payer: Medicaid Other

## 2019-11-19 ENCOUNTER — Telehealth: Payer: Self-pay

## 2019-11-19 VITALS — BP 166/98 | HR 121

## 2019-11-19 DIAGNOSIS — Z8759 Personal history of other complications of pregnancy, childbirth and the puerperium: Secondary | ICD-10-CM

## 2019-11-19 DIAGNOSIS — Z98891 History of uterine scar from previous surgery: Secondary | ICD-10-CM

## 2019-11-19 DIAGNOSIS — O115 Pre-existing hypertension with pre-eclampsia, complicating the puerperium: Principal | ICD-10-CM

## 2019-11-19 DIAGNOSIS — O1003 Pre-existing essential hypertension complicating the puerperium: Secondary | ICD-10-CM | POA: Insufficient documentation

## 2019-11-19 DIAGNOSIS — O119 Pre-existing hypertension with pre-eclampsia, unspecified trimester: Secondary | ICD-10-CM | POA: Diagnosis present

## 2019-11-19 DIAGNOSIS — O165 Unspecified maternal hypertension, complicating the puerperium: Secondary | ICD-10-CM

## 2019-11-19 DIAGNOSIS — H579 Unspecified disorder of eye and adnexa: Secondary | ICD-10-CM

## 2019-11-19 LAB — CBC
HCT: 36.3 % (ref 36.0–46.0)
Hemoglobin: 12.4 g/dL (ref 12.0–15.0)
MCH: 31.1 pg (ref 26.0–34.0)
MCHC: 34.2 g/dL (ref 30.0–36.0)
MCV: 91 fL (ref 80.0–100.0)
Platelets: 334 10*3/uL (ref 150–400)
RBC: 3.99 MIL/uL (ref 3.87–5.11)
RDW: 11.9 % (ref 11.5–15.5)
WBC: 7.3 10*3/uL (ref 4.0–10.5)
nRBC: 0 % (ref 0.0–0.2)

## 2019-11-19 LAB — COMPREHENSIVE METABOLIC PANEL
ALT: 61 U/L — ABNORMAL HIGH (ref 0–44)
AST: 42 U/L — ABNORMAL HIGH (ref 15–41)
Albumin: 3.4 g/dL — ABNORMAL LOW (ref 3.5–5.0)
Alkaline Phosphatase: 89 U/L (ref 38–126)
Anion gap: 10 (ref 5–15)
BUN: 15 mg/dL (ref 6–20)
CO2: 26 mmol/L (ref 22–32)
Calcium: 9 mg/dL (ref 8.9–10.3)
Chloride: 102 mmol/L (ref 98–111)
Creatinine, Ser: 0.65 mg/dL (ref 0.44–1.00)
GFR calc Af Amer: >60 mL/min (ref >60)
GFR calc non Af Amer: >60 mL/min (ref >60)
Glucose, Bld: 99 mg/dL (ref 70–99)
Potassium: 4.1 mmol/L (ref 3.5–5.1)
Sodium: 138 mmol/L (ref 135–145)
Total Bilirubin: 0.4 mg/dL (ref 0.3–1.2)
Total Protein: 6.6 g/dL (ref 6.5–8.1)

## 2019-11-19 LAB — TYPE AND SCREEN
ABO/RH(D): B NEG
Antibody Screen: POSITIVE

## 2019-11-19 MED ORDER — NIFEDIPINE ER OSMOTIC RELEASE 30 MG PO TB24: 60.0000 mg | ORAL_TABLET | Freq: Two times a day (BID) | ORAL | Status: DC

## 2019-11-19 MED ORDER — HYDRALAZINE HCL 10 MG PO TABS: 10.0000 mg | ORAL_TABLET | Freq: Three times a day (TID) | ORAL | 0 refills | Status: DC

## 2019-11-19 MED ORDER — NIFEDIPINE ER OSMOTIC RELEASE 30 MG PO TB24
60.0000 mg | ORAL_TABLET | Freq: Two times a day (BID) | ORAL | Status: DC
  Administered 2019-11-19 – 2019-11-20 (×2): 60 mg via ORAL
  Filled 2019-11-19 (×2): qty 2

## 2019-11-19 MED ORDER — DOCUSATE SODIUM 100 MG PO CAPS
100.0000 mg | ORAL_CAPSULE | Freq: Every day | ORAL | Status: DC
  Administered 2019-11-20: 100 mg via ORAL
  Filled 2019-11-19: qty 1

## 2019-11-19 MED ORDER — HYDROCODONE-ACETAMINOPHEN 5-325 MG PO TABS
1.0000 | ORAL_TABLET | ORAL | Status: DC | PRN
  Administered 2019-11-20: 1 via ORAL
  Filled 2019-11-19: qty 1

## 2019-11-19 MED ORDER — PRENATAL MULTIVITAMIN CH: 1.0000 | ORAL_TABLET | Freq: Every day | ORAL | Status: DC

## 2019-11-19 MED ORDER — ACETAMINOPHEN 325 MG PO TABS: 650.0000 mg | ORAL_TABLET | ORAL | Status: DC | PRN

## 2019-11-19 MED ORDER — PRENATAL MULTIVITAMIN CH
1.0000 | ORAL_TABLET | Freq: Every day | ORAL | Status: DC
  Administered 2019-11-20: 1 via ORAL
  Filled 2019-11-19: qty 1

## 2019-11-19 MED ORDER — ZOLPIDEM TARTRATE 5 MG PO TABS: 5.0000 mg | ORAL_TABLET | Freq: Every evening | ORAL | Status: DC | PRN

## 2019-11-19 MED ORDER — IBUPROFEN 800 MG PO TABS
800.0000 mg | ORAL_TABLET | Freq: Four times a day (QID) | ORAL | Status: DC
  Administered 2019-11-19 – 2019-11-20 (×3): 800 mg via ORAL
  Filled 2019-11-19 (×3): qty 1

## 2019-11-19 MED ORDER — CALCIUM CARBONATE ANTACID 500 MG PO CHEW: 2.0000 | CHEWABLE_TABLET | ORAL | Status: DC | PRN

## 2019-11-19 NOTE — Progress Notes (Signed)
Subjective:  Erica Atkins is a 39 y.o. female here for BP check.   Hypertension ROS: no medication side effects noted, no TIA's, no chest pain on exertion, no dyspnea on exertion, no swelling of ankles and palpitations described as heart racing. Vision changes described as seeing spots.   Objective:  BP (!) 166/98   Pulse (!) 121   LMP 05/06/2019 (Exact Date)   Appearance alert, well appearing, and in no distress. General exam BP noted to be well controlled today in office.    Assessment:   Blood Pressure needs improvement.  Dr. Ilda Basset made aware of symptoms and blood pressure. Dr. Ilda Basset to talk with patient.    Plan:  Dr. Ilda Basset to discuss plan of care with patient.

## 2019-11-19 NOTE — Telephone Encounter (Signed)
Pt calling to give [place] a report; del Thurs by c/s; had preeclampsia; baby is in the NICU at [facility]; pt was d/c'd yesterday; has to go back to [facility] for BP ck; cxl'd appt on the 27th.  Will get back to [place].  [phone]
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
## 2019-11-19 NOTE — Progress Notes (Addendum)
Obstetrics and Gynecology Visit Return Patient Evaluation  Work In Visit  Appointment Date: 11/19/2019  Primary Care Provider: [place], [place] for [facility]  Chief Complaint: elevated BPs at home  History of Present Illness:  Katrice Goel is a 39 y.o. with above CC  Patient called with BPs of 170s/100s at home and seeing spots so patient asked to come to office for eval and work in visit.  Patient still occasionally seeing spots and had HA last night that went way with flexeril.   She states she took her enalapril today and also her procardia this morning and took her pm procardia early before she came in because her BPs were still elevated.   She is s/p 8/19 pLTCS at 27/3 for breech and severe pre-eclampsia.   Pt discharged yesterday on enalapril 10 qday and procardia 60 bid and was running in the 140s-150s/90s  Review of Systems: as noted in the History of Present Illness.  Patient Active Problem List   Diagnosis Date Noted  . Cesarean delivery delivered 11/15/2019  . Chronic hypertension with superimposed preeclampsia 11/06/2019  . Headache in pregnancy, antepartum, second trimester   . Chronic hypertension affecting pregnancy 11/04/2019  . Elevated blood pressure affecting pregnancy in third trimester, antepartum 11/03/2019  . Elevated blood pressure affecting pregnancy in first trimester, antepartum 09/04/2019  . Aneuploidy of fetus affecting management of mother in singleton pregnancy 07/26/2019  . Supervision of high risk pregnancy, antepartum 07/05/2019  . Advanced maternal age in multigravida   . Abnormal Pap smear of cervix 11/27/2018  . Rh negative status during pregnancy 11/22/2018  . Attention deficit hyperactivity disorder (ADHD) 11/21/2018  . Depression 11/21/2018  . Prenatal care, subsequent pregnancy, first trimester 11/20/2018  . Trapezius muscle spasm 01/07/2014  . Chronic pain syndrome 07/02/2013  . Thyroid  nodule, uninodular 06/20/2013   Medications:  As per HPI  Allergies: is allergic to duloxetine, lactose intolerance (gi), other, pregabalin, tizanidine hcl, amitriptyline, gabapentin, robaxin [methocarbamol], and tramadol.  Physical Exam:  BP (!) 166/98   Pulse (!) 121   LMP 05/06/2019 (Exact Date)  There is no height or weight on file to calculate BMI. General appearance: Well nourished, well developed female in no acute distress.  CV: no MRGs, normal s1 and s2. HR 120s Pulmonary: CTAB Abdomen: +BS, soft, nttp, nd. Honeycomb dressing removed and incision c/d/i with steri strips in place.  Neuro/Psych:  Normal mood and affect.  2+ brachial b/l   Assessment: pt stable  Plan:  1. Postpartum hypertension In the hospital she had to get IV labetalol and hydralazine. I told her that I feel her history and current situation is too complex for outpatient management b/c she's on max dose procardia and the enalapril takes time to really work so she may need to be on a completely new regimen and she also needs labs.   D/w Dr. Rosana Hoes.    RTC: 1wk BP check  Durene Romans MD Attending Center for [employer] [facility])

## 2019-11-20 DIAGNOSIS — O115 Pre-existing hypertension with pre-eclampsia, complicating the puerperium: Secondary | ICD-10-CM | POA: Diagnosis not present

## 2019-11-20 LAB — COMPREHENSIVE METABOLIC PANEL
ALT: 48 U/L — ABNORMAL HIGH (ref 0–44)
AST: 35 U/L (ref 15–41)
Albumin: 3 g/dL — ABNORMAL LOW (ref 3.5–5.0)
Alkaline Phosphatase: 86 U/L (ref 38–126)
Anion gap: 9 (ref 5–15)
BUN: 19 mg/dL (ref 6–20)
CO2: 24 mmol/L (ref 22–32)
Calcium: 8.6 mg/dL — ABNORMAL LOW (ref 8.9–10.3)
Chloride: 105 mmol/L (ref 98–111)
Creatinine, Ser: 0.47 mg/dL (ref 0.44–1.00)
GFR calc Af Amer: >60 mL/min (ref >60)
GFR calc non Af Amer: >60 mL/min (ref >60)
Glucose, Bld: 124 mg/dL — ABNORMAL HIGH (ref 70–99)
Potassium: 3.7 mmol/L (ref 3.5–5.1)
Sodium: 138 mmol/L (ref 135–145)
Total Bilirubin: 0.1 mg/dL — ABNORMAL LOW (ref 0.3–1.2)
Total Protein: 6.3 g/dL — ABNORMAL LOW (ref 6.5–8.1)

## 2019-11-20 LAB — CBC
HCT: 34.3 % — ABNORMAL LOW (ref 36.0–46.0)
Hemoglobin: 11.8 g/dL — ABNORMAL LOW (ref 12.0–15.0)
MCH: 31.1 pg (ref 26.0–34.0)
MCHC: 34.4 g/dL (ref 30.0–36.0)
MCV: 90.5 fL (ref 80.0–100.0)
Platelets: 343 10*3/uL (ref 150–400)
RBC: 3.79 MIL/uL — ABNORMAL LOW (ref 3.87–5.11)
RDW: 12 % (ref 11.5–15.5)
WBC: 6.6 10*3/uL (ref 4.0–10.5)
nRBC: 0 % (ref 0.0–0.2)

## 2019-11-20 MED ORDER — ENALAPRIL MALEATE 5 MG PO TABS
10.0000 mg | ORAL_TABLET | Freq: Every day | ORAL | Status: DC
  Administered 2019-11-20: 10 mg via ORAL
  Filled 2019-11-20 (×2): qty 2

## 2019-11-20 NOTE — Progress Notes (Signed)
Discharge instructions given to pt. Discussed signs and symptoms to report to the MD, upcoming appointments, and meds. Pt verbalizes understanding and has no questions or concerns at this time. Pt discharged from hospital in stable condition.

## 2019-11-20 NOTE — Discharge Summary (Signed)
Antenatal Physician Discharge Summary  Patient ID: Erica Atkins MRN: [id number] DOB/AGE: 1980-07-07 39 y.o.  Admit date: 11/19/2019 Discharge date: 11/20/2019  Admission Diagnoses: chronic hypertension with superimposed preeclampsia with severe features  Discharge Diagnoses: same  Significant Diagnostic Studies:  Results for orders placed or performed during the hospital encounter of 11/19/19 (from the past 168 hour(s))  Comprehensive metabolic panel   Collection Time: 11/19/19  6:45 PM  Result Value Ref Range   Sodium 138 135 - 145 mmol/L   Potassium 4.1 3.5 - 5.1 mmol/L   Chloride 102 98 - 111 mmol/L   CO2 26 22 - 32 mmol/L   Glucose, Bld 99 70 - 99 mg/dL   BUN 15 6 - 20 mg/dL   Creatinine, Ser 0.65 0.44 - 1.00 mg/dL   Calcium 9.0 8.9 - 10.3 mg/dL   Total Protein 6.6 6.5 - 8.1 g/dL   Albumin 3.4 (L) 3.5 - 5.0 g/dL   AST 42 (H) 15 - 41 U/L   ALT 61 (H) 0 - 44 U/L   Alkaline Phosphatase 89 38 - 126 U/L   Total Bilirubin 0.4 0.3 - 1.2 mg/dL   GFR calc non Af Amer >60 >60 mL/min   GFR calc Af Amer >60 >60 mL/min   Anion gap 10 5 - 15  CBC   Collection Time: 11/19/19  6:45 PM  Result Value Ref Range   WBC 7.3 4.0 - 10.5 K/uL   RBC 3.99 3.87 - 5.11 MIL/uL   Hemoglobin 12.4 12.0 - 15.0 g/dL   HCT 36.3 36 - 46 %   MCV 91.0 80.0 - 100.0 fL   MCH 31.1 26.0 - 34.0 pg   MCHC 34.2 30.0 - 36.0 g/dL   RDW 11.9 11.5 - 15.5 %   Platelets 334 150 - 400 K/uL   nRBC 0.0 0.0 - 0.2 %  Type and screen [place]   Collection Time: 11/19/19  6:45 PM  Result Value Ref Range   ABO/RH(D) B NEG    Antibody Screen POS    Sample Expiration 11/22/2019,2359    Antibody Identification      PASSIVELY ACQUIRED ANTI-D Performed at [facility], [address] [place], [place] [address]   CBC   Collection Time: 11/20/19 10:42 AM  Result Value Ref Range   WBC 6.6 4.0 - 10.5 K/uL   RBC 3.79 (L) 3.87 - 5.11 MIL/uL   Hemoglobin 11.8 (L) 12.0 - 15.0 g/dL   HCT 34.3 (L) 36 -  46 %   MCV 90.5 80.0 - 100.0 fL   MCH 31.1 26.0 - 34.0 pg   MCHC 34.4 30.0 - 36.0 g/dL   RDW 12.0 11.5 - 15.5 %   Platelets 343 150 - 400 K/uL   nRBC 0.0 0.0 - 0.2 %  Comprehensive metabolic panel   Collection Time: 11/20/19 10:42 AM  Result Value Ref Range   Sodium 138 135 - 145 mmol/L   Potassium 3.7 3.5 - 5.1 mmol/L   Chloride 105 98 - 111 mmol/L   CO2 24 22 - 32 mmol/L   Glucose, Bld 124 (H) 70 - 99 mg/dL   BUN 19 6 - 20 mg/dL   Creatinine, Ser 0.47 0.44 - 1.00 mg/dL   Calcium 8.6 (L) 8.9 - 10.3 mg/dL   Total Protein 6.3 (L) 6.5 - 8.1 g/dL   Albumin 3.0 (L) 3.5 - 5.0 g/dL   AST 35 15 - 41 U/L   ALT 48 (H) 0 - 44  U/L   Alkaline Phosphatase 86 38 - 126 U/L   Total Bilirubin 0.1 (L) 0.3 - 1.2 mg/dL   GFR calc non Af Amer >60 >60 mL/min   GFR calc Af Amer >60 >60 mL/min   Anion gap 9 5 - 15  Results for orders placed or performed during the hospital encounter of 11/05/19 (from the past 168 hour(s))  Type and screen [place]   Collection Time: 11/14/19  7:02 AM  Result Value Ref Range   ABO/RH(D) B NEG    Antibody Screen POS    Sample Expiration 11/17/2019,2359    Antibody Identification      PASSIVELY ACQUIRED ANTI-D Performed at [facility] [address] [place], [place] [address]   CBC   Collection Time: 11/14/19  2:37 PM  Result Value Ref Range   WBC 11.0 (H) 4.0 - 10.5 K/uL   RBC 3.71 (L) 3.87 - 5.11 MIL/uL   Hemoglobin 11.7 (L) 12.0 - 15.0 g/dL   HCT 34.1 (L) 36 - 46 %   MCV 91.9 80.0 - 100.0 fL   MCH 31.5 26.0 - 34.0 pg   MCHC 34.3 30.0 - 36.0 g/dL   RDW 11.9 11.5 - 15.5 %   Platelets 191 150 - 400 K/uL   nRBC 0.0 0.0 - 0.2 %  Surgical pathology   Collection Time: 11/14/19  4:00 PM  Result Value Ref Range   SURGICAL PATHOLOGY      SURGICAL PATHOLOGY CASE: [id number] PATIENT: Erica Atkins Surgical Pathology Report     Clinical History: Primary cesarean section due to severe pre eclampsia; [redacted]w[redacted]d; Fetus has possible  Klinefelter's syndrome(jmc)     FINAL MICROSCOPIC DIAGNOSIS:  A. PLACENTA, SINGLETON, DELIVERY: - Third trimester placenta with accelerated villous maturation and infarct. - Three-vessel cord. - No acute chorioamnionitis or funisitis.  B. FALLOPIAN TUBE CYST, LEFT, EXCISION: - Paratubal cyst.   GROSS DESCRIPTION:  A.  Specimen received: Nelda [place]se[place] placenta, umbilical cord, membranes Size and shape: 14 x 10 x 2 cm, ovoid Weight: 347 g Umbilical cord: Inserts marginally, is 13 cm in length and dilated to 2 cm in diameter, hypocoiled (1 coil per 10 cm), with 3 vessels upon sectioning. Membranes: Inserts marginally, smooth, tan, glistening, translucent Fetal surface: Blue purple with flattened vessels and minimal subchorionic fibrin deposition Maternal surface: Focally  markedly disrupted, but appears complete upon reconstruction, dark red-brown, with focal firm fibrin deposition Cut surface: Spongy, red-brown, with 3 firm tan maternal surface lesions averaging 1 cm, 5% total cut surface. Block summary: 1-4 = representative sections  B.  Received fresh is a 3.5 x 2.7 x 2.2 cm unilocular intact tan cyst with clear serous.  Representative sections are submitted in 1 cassette. (AK 11/16/2019)     Final Diagnosis performed by Vicente Males, MD.   Electronically signed 11/18/2019 Technical and / or Professional components performed at [facility], [place] [address], [place], [place] [address].  Immunohistochemistry Technical component (if applicable) was performed at [facility]. [address] [place], [place],  [address].   IMMUNOHISTOCHEMISTRY DISCLAIMER (if applicable): Some of these immunohistochemical stains may have been developed and the performance characteristics determine by [facility]. Some may not have been cleared or approved by the U.S. Food and Drug Administration. The FDA has determined that such  clearance or approval is not necessary. This test is used for clinical purposes. It should not be regarded as investigational or for research. This laboratory is  certified under the [place] (CLIA-88) as qualified to perform high complexity clinical laboratory testing.  The controls stained appropriately.   Comprehensive metabolic panel   Collection Time: 11/15/19  6:43 AM  Result Value Ref Range   Sodium 129 (L) 135 - 145 mmol/L   Potassium 4.2 3.5 - 5.1 mmol/L   Chloride 99 98 - 111 mmol/L   CO2 21 (L) 22 - 32 mmol/L   Glucose, Bld 92 70 - 99 mg/dL   BUN 13 6 - 20 mg/dL   Creatinine, Ser 0.47 0.44 - 1.00 mg/dL   Calcium 7.0 (L) 8.9 - 10.3 mg/dL   Total Protein 5.6 (L) 6.5 - 8.1 g/dL   Albumin 2.6 (L) 3.5 - 5.0 g/dL   AST 35 15 - 41 U/L   ALT 31 0 - 44 U/L   Alkaline Phosphatase 80 38 - 126 U/L   Total Bilirubin 0.6 0.3 - 1.2 mg/dL   GFR calc non Af Amer >60 >60 mL/min   GFR calc Af Amer >60 >60 mL/min   Anion gap 9 5 - 15  CBC   Collection Time: 11/15/19  6:43 AM  Result Value Ref Range   WBC 19.9 (H) 4.0 - 10.5 K/uL   RBC 3.91 3.87 - 5.11 MIL/uL   Hemoglobin 12.2 12.0 - 15.0 g/dL   HCT 35.7 (L) 36 - 46 %   MCV 91.3 80.0 - 100.0 fL   MCH 31.2 26.0 - 34.0 pg   MCHC 34.2 30.0 - 36.0 g/dL   RDW 12.1 11.5 - 15.5 %   Platelets 201 150 - 400 K/uL   nRBC 0.0 0.0 - 0.2 %  Comprehensive metabolic panel   Collection Time: 11/16/19  6:43 AM  Result Value Ref Range   Sodium 137 135 - 145 mmol/L   Potassium 3.9 3.5 - 5.1 mmol/L   Chloride 106 98 - 111 mmol/L   CO2 20 (L) 22 - 32 mmol/L   Glucose, Bld 106 (H) 70 - 99 mg/dL   BUN 13 6 - 20 mg/dL   Creatinine, Ser 0.72 0.44 - 1.00 mg/dL   Calcium 7.6 (L) 8.9 - 10.3 mg/dL   Total Protein 5.4 (L) 6.5 - 8.1 g/dL   Albumin 2.6 (L) 3.5 - 5.0 g/dL   AST 33 15 - 41 U/L   ALT 30 0 - 44 U/L   Alkaline Phosphatase 70 38 - 126 U/L   Total Bilirubin 0.3 0.3 - 1.2 mg/dL   GFR calc non Af Amer  >60 >60 mL/min   GFR calc Af Amer >60 >60 mL/min   Anion gap 11 5 - 15    Treatments: antihypertensives  Hospital Course:  This is a 39 y.o. [id number] admitted yesterday, POD#4 from cesarean section at 27 weeks for chtn with superimposed severe preeclampsia. On day of this admission was seen in OB office for f/u and found to have severe-range blood pressures. Here blood pressures all mild-range 140s/90. We continued her home antihypertensives (nifedipine 60 mg po bid and enalapril 10 mg po qd. She was not treated with magnesium. On day of discharge patient denies headache, visual changes, dyspnea, upper abdominal pain. LFTs day of admission noted to be mildly elevated as there were intermittently during her hospitalization earlier this month. Repeat LFTs also mildly elevated, no other significant lab findings. We will plan to continue home nifedipine and enalapril at discharge.  [ ]  patient already has scheduled OB f/u on 8/27. I advised home bp  monitoring daily until then and to contact OB if severely elevated or if symptoms of severe disease [ ]  consider repeat CMP at f/u to follow kidney function and LFTs   Discharge Exam: BP (!) 148/100 (BP Location: Left Arm)   Pulse 86   Temp 98.2 F (36.8 C) (Oral)   Resp 18   LMP 05/06/2019 (Exact Date)   SpO2 100%  General appearance: alert, cooperative and appears stated age Head: Normocephalic, without obvious abnormality, atraumatic Resp: clear to auscultation bilaterally Cardio: regular rate and rhythm, S1, S2 normal, no murmur, click, rub or gallop Extremities: no edema, redness or tenderness in the calves or thighs  Discharge Condition: good  Disposition:  Discharge disposition: 01-Home or Self Care       Discharge Instructions    Discharge activity:  No Restrictions   Complete by: As directed    Discharge diet:  No restrictions   Complete by: As directed        Follow-up Information    Your OB/GYN Follow up in 2 day(s).    Why: as scheduled              Signed: Desma Maxim M.D. 11/20/2019, 12:06 PM

## 2019-11-20 NOTE — Lactation Note (Signed)
This note was copied from a baby's chart. Lactation Consultation Note  Patient Name: Erica Atkins [id number] Date: 11/20/2019 Reason for consult: Follow-up assessment;Other (Comment);NICU baby (readmit)  [phone] - [phone] - I followed up with Ms. Guyett, a P1 readmitted for HBP. She had a DEBP at the bedside and her kit. She appeared to have supplies needed.  She had 3 oz of pumped breast milk in the refrigerator. She reports that she pumps for 1 hour at a time sometimes because it takes a while to feel empty.  I observed her pump and did a flange check. She was using the initiate phase. I directed her to the maintain phase. I made a hands-free pumping bra using a belly band, and she verbalized appreciation. I showed her how to gently massage the breasts while pumping. A size 27 flange appeared appropriate.  I reviewed pumping practices and recommended pumping every 2-3 hours during the day and every 3-4 hours at night. It's unclear if she has been pumping at night, but I did explain the purpose of nighttime pumping and encouraged her to set an alarm.  I could not find any NICU labels for her EBM. I called the RN in NICU assigned to baby, and she stated that she would print some labels and tube them down. I left a message for OB RN, Jene Every, to expect the labels today.  All questions answered at this time.    Maternal Data Does the patient have breastfeeding experience prior to this delivery?: No   Interventions Interventions: Breast feeding basics reviewed;DEBP  Lactation Tools Discussed/Used Tools: Pump Breast pump type: Double-Electric Breast Pump WIC Program: Yes Pump Review: Setup, frequency, and cleaning;Milk Storage   Consult Status Consult Status: Follow-up Date: 11/21/19 Follow-up type: In-patient    Lenore Manner 11/20/2019, 8:55 AM

## 2019-11-22 ENCOUNTER — Other Ambulatory Visit: Payer: Self-pay

## 2019-11-22 ENCOUNTER — Encounter: Payer: Medicaid Other | Admitting: Obstetrics and Gynecology

## 2019-11-23 ENCOUNTER — Ambulatory Visit: Payer: Self-pay

## 2019-11-23 NOTE — Lactation Note (Signed)
This note was copied from a baby's chart. Lactation Consultation Note  Patient Name: Erica Atkins [id number] Date: 11/23/2019 Reason for consult: Follow-up assessment;Primapara;1st time breastfeeding;NICU baby;Preterm <34wks  LC followed up with Ms. Heinsohn and her 61 day old son, Erica Atkins. She is pumping and obtaining about 3 ounces/combined per pump. She reports that she has not been pumping 8 times a day. There were a few nights where she slept through without waking to pump, and there were a few times she forgot her pump supplies when she came to the hospital.  I encouraged her to try to strive for pumping every 3-4 hours at night and every 2-3 hours during the day. I also stated that she could call lactation if she needed any pumping supplies (vs. Not pumping).  Baby is 28 weeks 5 days. I set a follow up for about 7-10 days.  I reviewed pumping settings to make sure that she is using the maintenance phase.   Interventions Interventions: Breast feeding basics reviewed;DEBP  Lactation Tools Discussed/Used Pump Review: Setup, frequency, and cleaning   Consult Status Consult Status: Follow-up Date: 11/30/19 Follow-up type: In-patient    Lenore Manner 11/23/2019, 3:04 PM

## 2019-11-25 ENCOUNTER — Encounter: Payer: Self-pay | Admitting: Radiology

## 2019-11-26 ENCOUNTER — Ambulatory Visit (INDEPENDENT_AMBULATORY_CARE_PROVIDER_SITE_OTHER): Payer: Medicaid Other | Admitting: *Deleted

## 2019-11-26 ENCOUNTER — Other Ambulatory Visit: Payer: Self-pay | Admitting: Obstetrics and Gynecology

## 2019-11-26 ENCOUNTER — Other Ambulatory Visit: Payer: Self-pay

## 2019-11-26 VITALS — BP 143/98 | HR 85

## 2019-11-26 DIAGNOSIS — O165 Unspecified maternal hypertension, complicating the puerperium: Secondary | ICD-10-CM

## 2019-11-26 MED ORDER — ENALAPRIL MALEATE 20 MG PO TABS: 20.0000 mg | ORAL_TABLET | Freq: Every day | ORAL | 1 refills | Status: AC

## 2019-11-26 NOTE — Progress Notes (Signed)
Patient was assessed and managed by nursing staff during this encounter. I have reviewed the chart and agree with the documentation and plan. I have also made any necessary editorial changes.  Mora Bellman, MD 11/26/2019 2:58 PM

## 2019-11-26 NOTE — Progress Notes (Signed)
Subjective:  Erica Atkins is a 39 y.o. female here for BP check.   Hypertension ROS: taking medications as instructed, no medication side effects noted, no TIA's, no chest pain on exertion, no dyspnea on exertion, no swelling of ankles and is still having some spot in her vision..    Objective:  BP (!) 143/98   Pulse 85   Appearance alert, well appearing, and in no distress. General exam BP noted to be stable today in office.    Assessment:   Blood Pressure needs improvement.   Plan:  Spoke with Dr Elly Modena and informed her of patients BP today and medicaitons she is taking. To recheck CMP, and to increase Vasotec to 20mg  daily and to recheck  BP on friday. Marland Kitchen

## 2019-11-27 ENCOUNTER — Encounter: Payer: Self-pay | Admitting: *Deleted

## 2019-11-27 LAB — COMPREHENSIVE METABOLIC PANEL WITH GFR
ALT: 25 [IU]/L (ref 0–32)
AST: 21 [IU]/L (ref 0–40)
Albumin/Globulin Ratio: 1.6 (ref 1.2–2.2)
Albumin: 4.2 g/dL (ref 3.8–4.8)
Alkaline Phosphatase: 97 [IU]/L (ref 48–121)
BUN/Creatinine Ratio: 33 — ABNORMAL HIGH (ref 9–23)
BUN: 20 mg/dL (ref 6–20)
Bilirubin Total: 0.2 mg/dL (ref 0.0–1.2)
CO2: 24 mmol/L (ref 20–29)
Calcium: 9 mg/dL (ref 8.7–10.2)
Chloride: 103 mmol/L (ref 96–106)
Creatinine, Ser: 0.61 mg/dL (ref 0.57–1.00)
GFR calc Af Amer: 132 mL/min/{1.73_m2}
GFR calc non Af Amer: 115 mL/min/{1.73_m2}
Globulin, Total: 2.6 g/dL (ref 1.5–4.5)
Glucose: 125 mg/dL — ABNORMAL HIGH (ref 65–99)
Potassium: 3.8 mmol/L (ref 3.5–5.2)
Sodium: 140 mmol/L (ref 134–144)
Total Protein: 6.8 g/dL (ref 6.0–8.5)

## 2019-11-28 ENCOUNTER — Ambulatory Visit: Payer: Self-pay

## 2019-11-28 NOTE — Lactation Note (Signed)
This note was copied from a baby's chart. Lactation Consultation Note  Patient Name: Erica Atkins QGBEE'F Date: 11/28/2019 Reason for consult: Follow-up assessment;Mother's request;NICU baby;Preterm <34wks  Mom reports she is not getting much milk with pumping now.  Mom reports her nipples are really sore as well.  Asked if LC could see nipples.  Moms nipples are intact but reddened.  Mom reports right nipple more sore than the left.  Mom has been using 27 mm flanges.  Mom has not been using any lubrication with pumping.  Urged mom to add lubrication to pumping.  Urged either extra virgin olive oil or organic food grade coconut oil.  Discussed massaging nipple and areolar complex with the lubricant prior to pumping.  Mom has been using hands free bra.  Urged mom not to use hands free bra for a couple of days and make sure her nipples were staying in the very center.  Mom has not been doing massage or hand expression.  Showed mom how to massage and then hand express.   Mom reports nipples a little painful with hand expression. Mom struggles with hand expression some even when I put my hand over hers.  Urged her to watch Murphy on Pumping video.  Discussed getting dad to help with both as well.  After doing hand expression for a few minutes going back and forth assisted mom with pumping.  Measured moms nipples.  Moms right nipple is approximately 18 mm and her left nipple approximately 20 mm.  Discussed how LC felt that mom should use 24 mm flanges with pumping. Discussed also the speed/suction level of the pump.  Assisted mom with pumping with 24 mm flanges. Mom wants to hold the flanges way up high.  Urged her to try and hold at more natural level.   Mom reports it is still uncomfortable but more tolerable.  Also discussed possibility of different flanges or inserts that were more flexible and less hard.  Urged mom to hand express past pumping as well and then rub some expressed mothers  milk on nipples and air dry. Discussed power pumping.  Discussed warmth while pumping. Stayed duration of pumping and assisted mom and mom reports she feels better now because she got more milk with pumping at this session.  Praised offering breastmilk to her baby.  Urged mom to call lactation as needed.   Mae Cianci Thompson Caul 11/28/2019, 6:32 PM

## 2019-11-29 ENCOUNTER — Other Ambulatory Visit: Payer: Self-pay

## 2019-11-29 ENCOUNTER — Ambulatory Visit (INDEPENDENT_AMBULATORY_CARE_PROVIDER_SITE_OTHER): Payer: Medicaid Other | Admitting: *Deleted

## 2019-11-29 VITALS — BP 127/81 | HR 92

## 2019-11-29 DIAGNOSIS — O119 Pre-existing hypertension with pre-eclampsia, unspecified trimester: Secondary | ICD-10-CM

## 2019-11-29 NOTE — Progress Notes (Signed)
Patient seen and assessed by nursing staff.  Agree with documentation and plan.  

## 2019-11-29 NOTE — Progress Notes (Signed)
Subjective:  Erica Atkins is a 39 y.o. female here for BP check.   Hypertension ROS: taking medications as instructed, no medication side effects noted, no TIA's, no chest pain on exertion, no dyspnea on exertion and no swelling of ankles.    Objective:  BP 127/81   Pulse 92   Appearance alert, well appearing, and in no distress. General exam BP noted to be well controlled today in office.    Assessment:   Blood Pressure well controlled.   Plan:  Follow up at postpartum visit and as needed.

## 2019-12-03 ENCOUNTER — Ambulatory Visit: Payer: Self-pay

## 2019-12-03 NOTE — Lactation Note (Signed)
This note was copied from a baby's chart. Lactation Consultation Note  Patient Name: Erica Atkins Date: 12/03/2019 Reason for consult: Follow-up assessment;Mother's request;Primapara;1st time breastfeeding;NICU baby;Preterm <34wks;Infant < 6lbs  LC in to visit with [name] at 50 weeks old.  Mom has been pumping consistently using the loaner pump (waiting on her insurance pump).  Mom has a great milk supply.    Mom concerned as she got called today asking for her to return her loaner pump.  Mom went to gift shop to rent a pump and gift shop was closed.  Mom concerned about not having a DEBP at home.  Reassured Mom that she should keep the loaner until she can either rent one from gift shop, or her insurance pump arrives.  Mom ordered a Medela Max Flow DEBP.    Baby STS with Mom on her chest.  Baby was extubated and is on CPAP.  Parents are thrilled that IV was DC'd yesterday.  Baby being gavage fed EBM and doing well.  Mom knows she can request LC prn.  Interventions Interventions: Breast feeding basics reviewed;Skin to skin;Breast massage;Hand express;DEBP  Lactation Tools Discussed/Used Tools: Pump Breast pump type: Double-Electric Breast Pump   Consult Status Consult Status: Follow-up Date: 12/10/19 Follow-up type: In-patient    Broadus John 12/03/2019, 5:36 PM

## 2019-12-05 ENCOUNTER — Telehealth: Payer: Self-pay | Admitting: *Deleted

## 2019-12-05 MED ORDER — CYCLOBENZAPRINE HCL 10 MG PO TABS: 10.0000 mg | ORAL_TABLET | Freq: Three times a day (TID) | ORAL | 2 refills | Status: AC | PRN

## 2019-12-05 NOTE — Telephone Encounter (Signed)
Pt called concerned that she has had a HA for the past week. Pt state she has been checking her BP's and they have been normal and she is taking her BP meds. Pt states she has been taking flexeril and that does help but by the evening the HA is back. Okay per Dr Kennon Rounds to take flexeril up to three times day as needed for HA, as long as BP remains normal. We will send refills for pt

## 2019-12-11 ENCOUNTER — Telehealth: Payer: Self-pay | Admitting: Obstetrics and Gynecology

## 2019-12-11 ENCOUNTER — Telehealth: Payer: Self-pay

## 2019-12-11 NOTE — Telephone Encounter (Signed)
Telephone call to [place] today.  Spoke to the triage nurse about patient needing a repeat PAP at her postpartum appointment that is scheduled for 12/16/2019 at 2pm with provider [place].  Per 12/24/2018 surgical path note by Dr. Glennon Mac for PAP to be repeated in 1 year (due 11/2019).  Triage Nurse to have Dr. Glennon Mac pass the information onto the High Risk group that will be completing the postpartum appointment on 12/16/2019. Dahlia Bailiff, RN

## 2019-12-11 NOTE — Telephone Encounter (Signed)
[facility] following up on patient needing pap around 12/25/19 due to high risk.  Patient has 6 wk PP with another provider 9/20.  Health Department wasn't sure if this pap would be done at her 6 wk PP with the other provider and didn't know if we should reach out to that provider to make sure this is done or if we need to get this scheduled back here with SDJ.  Please advise.

## 2019-12-12 ENCOUNTER — Ambulatory Visit: Payer: Self-pay

## 2019-12-12 NOTE — Telephone Encounter (Signed)
Patient aware and will give [place] a call when needed.

## 2019-12-12 NOTE — Lactation Note (Signed)
This note was copied from a baby's chart. Lactation Consultation Note  Patient Name: Erica Atkins HYIFO'Y Date: 12/12/2019   45 week old NICU infant.  Mother requesting DEBP loaner due to her Medela insurance pump broken within the first week.  Mother had recently return [facility] loaner pump and wanted another. She has contacted [facility] and stated to [facility] that pump would arrive within one week but LC checked tracker and replacement pump has been overnighted and will arrive tomorrow. Mother happy. Decided to contact [facility] which mother is certified for and request a loaner to use today and until her pump arrives and is reassured she has working pump. Talked to [facility] who stated they will mother today and get her pump today.      Maternal Data    Feeding Feeding Type: Breast Milk  LATCH Score                   Interventions    Lactation Tools Discussed/Used     Consult Status      [name] 12/12/2019, 2:12 PM

## 2019-12-16 ENCOUNTER — Encounter: Payer: Self-pay | Admitting: Radiology

## 2019-12-16 ENCOUNTER — Other Ambulatory Visit (HOSPITAL_COMMUNITY)
Admission: RE | Admit: 2019-12-16 | Discharge: 2019-12-16 | Disposition: A | Discharge status: Home / Self Care | Payer: Medicaid Other | Source: Ambulatory Visit | Attending: Obstetrics & Gynecology | Admitting: Obstetrics & Gynecology

## 2019-12-16 ENCOUNTER — Ambulatory Visit (INDEPENDENT_AMBULATORY_CARE_PROVIDER_SITE_OTHER): Payer: Medicaid Other | Admitting: Obstetrics & Gynecology

## 2019-12-16 ENCOUNTER — Other Ambulatory Visit: Payer: Self-pay

## 2019-12-16 ENCOUNTER — Encounter: Payer: Self-pay | Admitting: Obstetrics & Gynecology

## 2019-12-16 DIAGNOSIS — Z01812 Encounter for preprocedural laboratory examination: Secondary | ICD-10-CM | POA: Diagnosis not present

## 2019-12-16 DIAGNOSIS — O165 Unspecified maternal hypertension, complicating the puerperium: Secondary | ICD-10-CM | POA: Diagnosis not present

## 2019-12-16 DIAGNOSIS — R87810 Cervical high risk human papillomavirus (HPV) DNA test positive: Secondary | ICD-10-CM

## 2019-12-16 DIAGNOSIS — O115 Pre-existing hypertension with pre-eclampsia, complicating the puerperium: Secondary | ICD-10-CM

## 2019-12-16 DIAGNOSIS — R87610 Atypical squamous cells of undetermined significance on cytologic smear of cervix (ASC-US): Secondary | ICD-10-CM | POA: Diagnosis not present

## 2019-12-16 DIAGNOSIS — Z30430 Encounter for insertion of intrauterine contraceptive device: Secondary | ICD-10-CM | POA: Diagnosis not present

## 2019-12-16 DIAGNOSIS — O119 Pre-existing hypertension with pre-eclampsia, unspecified trimester: Secondary | ICD-10-CM

## 2019-12-16 LAB — POCT URINE PREGNANCY: Preg Test, Ur: NEGATIVE

## 2019-12-16 MED ORDER — LEVONORGESTREL 20 MCG/24HR IU IUD: INTRAUTERINE_SYSTEM | Freq: Once | INTRAUTERINE | Status: AC

## 2019-12-16 NOTE — Patient Instructions (Signed)
Intrauterine Device Insertion, Care After  This sheet gives you information about how to care for yourself after your procedure. Your health care provider may also give you more specific instructions. If you have problems or questions, contact your health care provider. What can I expect after the procedure? After the procedure, it is common to have:  Cramps and pain in the abdomen.  Light bleeding (spotting) or heavier bleeding that is like your menstrual period. This may last for up to a few days.  Lower back pain.  Dizziness.  Headaches.  Nausea. Follow these instructions at home:  Before resuming sexual activity, check to make sure that you can feel the IUD string(s). You should be able to feel the end of the string(s) below the opening of your cervix. If your IUD string is in place, you may resume sexual activity. ? If you had a hormonal IUD inserted more than 7 days after your most recent period started, you will need to use a backup method of birth control for 7 days after IUD insertion. Ask your health care provider whether this applies to you.  Continue to check that the IUD is still in place by feeling for the string(s) after every menstrual period, or once a month.  Take over-the-counter and prescription medicines only as told by your health care provider.  Do not drive or use heavy machinery while taking prescription pain medicine.  Keep all follow-up visits as told by your health care provider. This is important. Contact a health care provider if:  You have bleeding that is heavier or lasts longer than a normal menstrual cycle.  You have a fever.  You have cramps or abdominal pain that get worse or do not get better with medicine.  You develop abdominal pain that is new or is not in the same area of earlier cramping and pain.  You feel lightheaded or weak.  You have abnormal or bad-smelling discharge from your vagina.  You have pain during sexual  activity.  You have any of the following problems with your IUD string(s): ? The string bothers or hurts you or your sexual partner. ? You cannot feel the string. ? The string has gotten longer.  You can feel the IUD in your vagina.  You think you may be pregnant, or you miss your menstrual period.  You think you may have an STI (sexually transmitted infection). Get help right away if:  You have flu-like symptoms.  You have a fever and chills.  You can feel that your IUD has slipped out of place. Summary  After the procedure, it is common to have cramps and pain in the abdomen. It is also common to have light bleeding (spotting) or heavier bleeding that is like your menstrual period.  Continue to check that the IUD is still in place by feeling for the string(s) after every menstrual period, or once a month.  Keep all follow-up visits as told by your health care provider. This is important.  Contact your health care provider if you have problems with your IUD string(s), such as the string getting longer or bothering you or your sexual partner. This information is not intended to replace advice given to you by your health care provider. Make sure you discuss any questions you have with your health care provider. Document Revised: 02/24/2017 Document Reviewed: 02/03/2016 Elsevier Patient Education  2020 Elsevier Inc.  

## 2019-12-16 NOTE — Progress Notes (Signed)
[place] Partum Visit Note  Erica Atkins is a 39 y.o. [phone] female who presents for a postpartum visit. She is 4 weeks postpartum following a primary cesarean section.  I have fully reviewed the prenatal and intrapartum course. The delivery was at [redacted] week gestational weeks due to severe preeclampsia superimposed on chronic hypertension, breech presentation.  Anesthesia: spinal. Postpartum course has been complicated by a brief re-admission for BP control. Baby is in NICU. Baby is feeding by breast. Bleeding no bleeding. Bowel function is normal. Bladder function is normal. Patient is not sexually active. Desired contraception method is Mirena IUD. Postpartum depression screening: negative.  The pregnancy intention screening data noted above was reviewed. Potential methods of contraception were discussed. The patient elected to proceed with Mirena IUD, has used this successfully in the past.  Edinburgh Postnatal Depression Scale - 12/16/19 1414      Edinburgh Postnatal Depression Scale:  In the Past 7 Days   I have been able to laugh and see the funny side of things. 1    I have looked forward with enjoyment to things. 1    I have blamed myself unnecessarily when things went wrong. 0    I have been anxious or worried for no good reason. 0    I have felt scared or panicky for no good reason. 2    Things have been getting on top of me. 1    I have been so unhappy that I have had difficulty sleeping. 0    I have felt sad or miserable. 1    I have been so unhappy that I have been crying. 1    The thought of harming myself has occurred to me. 0    Edinburgh Postnatal Depression Scale Total 7          The following portions of the patient's history were reviewed and updated as appropriate: allergies, current medications, past family history, past medical history, past social history, past surgical history and problem list.  Review of Systems Pertinent items noted in HPI and remainder of  comprehensive ROS otherwise negative.    Objective:  Blood pressure 119/81, pulse 91, height 5\' 5"  (1.651 m), weight 168 lb 3.2 oz (76.3 kg), unknown if currently breastfeeding.  General:  alert and no distress   Breasts:  inspection negative, no nipple discharge or bleeding, no masses or nodularity palpable  Lungs: clear to auscultation bilaterally  Heart:  regular rate and rhythm  Abdomen: soft, non-tender; bowel sounds normal; no masses,  no organomegaly. Incision C/D/I   Vulva:  normal  Vagina: normal vagina, no discharge, exudate, lesion, or erythema  Cervix:  no bleeding following Pap  Corpus: normal size, contour, position, consistency, mobility, non-tender  Adnexa:  normal adnexa  Rectal Exam: Not performed.   IUD Insertion Procedure Note Patient identified, informed consent performed, consent signed.   Discussed risks of irregular bleeding, cramping, infection, malpositioning or misplacement of the IUD outside the uterus which may require further procedure such as laparoscopy. Also discussed >99% contraception efficacy, increased risk of ectopic pregnancy with failure of method.   Emphasized that this did not protect against STIs, condoms recommended during all sexual encounters. Time out was performed.  Urine pregnancy test negative.  Speculum placed in the vagina.  Cervix visualized.  After pap smear was done, cervix cleaned with Betadine x 2.  Grasped anteriorly with a single tooth tenaculum.  Uterus sounded to 7.5 cm.  Mirena IUD placed per manufacturer's recommendations.  Strings trimmed to 2 cm. Tenaculum was removed, good hemostasis noted.  Patient tolerated procedure well.   Patient was given post-procedure instructions.  She was advised to have backup contraception for one week.  Patient was also asked to check IUD strings periodically and follow up in 4 weeks for IUD check.   Assessment:   Normal postpartum exam. Pap smear done at today's visit given history of ASCUS  +HPV in 10/2018.  Plan:   Essential components of care per ACOG recommendations:  1.  Mood and well being: Patient with negative depression screening today. Reviewed local resources for support.  - Patient does not use tobacco or drugs.  2. Infant care and feeding:  -Patient currently breastmilk feeding? Yes.Reviewed importance of draining breast regularly to support lactation. -Social determinants of health (SDOH) reviewed in EPIC. No concerns.   3. Sexuality, contraception and birth spacing - Patient does not want a pregnancy in the next year.  Desired family size is 1 child.  - Reviewed forms of contraception in tiered fashion. Patient desired IUD, Mirena iUD inserted. Will be in place up to seven years.    4. Sleep and fatigue -Encouraged family/partner/community support of 4 hrs of uninterrupted sleep to help with mood and fatigue  5. Physical Recovery  - Discussed patients delivery, incision healing well. - Patient has urinary incontinence? No - Patient is safe to resume physical and sexual activity  6.  Health Maintenance - Last pap smear done ASCUS +HPV in 10/2018, repeat done today.  Will follow up results and manage accordingly.  7. Mammograms to start after age 5  7. CHTN -Dr. Lawerance Cruel (PCP) to follow up BP and further management.   Verita Schneiders, MD [facility], [place]

## 2019-12-18 ENCOUNTER — Ambulatory Visit: Payer: Self-pay

## 2019-12-18 NOTE — Lactation Note (Signed)
This note was copied from a baby's chart. Lactation Consultation Note  Patient Name: Erica Atkins TXLEZ'V Date: 12/18/2019 Reason for consult: Follow-up assessment   Mom was holding infant STS and mom shared how well infant was doing now that he was receiving breastmilk.  She is pumping 6 X daily and will sometimes stretch 6 hours at night to sleep.  She has the [facility] hospital grade pump and uses our pump when visiting in NICU.  Dad was in the room and [place] congratulated them on the birth of their infant.    Baby is 62 wks old tomorrow and mom was encouraged LC to continue to pump and that she would have Lactation support here when she needed.    Questions were answered regarding pumping schedule.    Mom occasionally has a clogged pore but is does resolve.  LC discussed ways to encourage the expression of the pore.    All questions answered.      Maternal Data    Feeding Feeding Type: Breast Milk  LATCH Score                   Interventions Interventions:  (pumping suggestions/ how to resolve a clogged nipple pore)  Lactation Tools Discussed/Used Tools: Pump   Consult Status Consult Status: PRN Follow-up type: In-patient    [name] [facility] 12/18/2019, 4:12 PM

## 2019-12-19 LAB — CYTOLOGY - PAP
Comment: NEGATIVE
Diagnosis: NEGATIVE
Diagnosis: REACTIVE
High risk HPV: NEGATIVE

## 2019-12-30 ENCOUNTER — Other Ambulatory Visit: Payer: Self-pay | Admitting: Obstetrics and Gynecology

## 2019-12-30 ENCOUNTER — Ambulatory Visit: Payer: Self-pay

## 2019-12-30 NOTE — Lactation Note (Signed)
This note was copied from a baby's chart. Lactation Consultation Note  Patient Name: Erica Atkins ALCMI'M Date: 12/30/2019   Infant arrived by [facility] this afternoon.  He was born at 27.3 weeks and is 34 weeks today.  Mom has lots of breast milk.  He is getting 26 cal MBM fortified with HMF mixed 1:1 SSC 30 Calorie to make 28 calorie milk.  Have not seen parents since he has arrived.      Maternal Data    Feeding Feeding Type: Breast Milk with Formula added  LATCH Score                   Interventions    Lactation Tools Discussed/Used     Consult Status      Jarold Motto 12/30/2019, 9:13 PM

## 2020-01-05 ENCOUNTER — Ambulatory Visit: Payer: Self-pay

## 2020-01-05 NOTE — Lactation Note (Signed)
This note was copied from a baby's chart. Lactation Consultation Note  Patient Name: Erica Atkins Erica Atkins Date: 01/05/2020 Reason for consult: Initial assessment;Mother's request;Difficult latch;Primapara;1st time breastfeeding;NICU baby;Preterm <34wks;Infant < 6lbs;Nipple pain/trauma;Other (Comment) (First time at breast - More lick & learn session)  Assisted mom with positioning for comfort with pillow support on right breast in football hold skin to skin.  Mom has a small red area on her left nipple which is slightly tender which is why mom opted to put him to the right breast for her first breast feed.  [name] was born at 27.3 weeks at [facility] to a preeclamptic mom.  His corrected gestational age is 34w 6d today.  This was more of a lick and learn session.  He did latch to the breast several times for short intervals in the 25 minutes he was at the breast.  Mom could feel a strong tug at the breast and pointed out a few swallows.  All vital signs and SATs remained within normal limits.  Mom was elated that he was latching and sucking.  Praised mom for her commitment to provide breast milk for [name].  Mom requesting assistance for tomorrow if he is allowed to go to the breast.  Told mom LC would be available after 9 am tomorrow and would love to assist her with breast feeding encouraging mom to call lactation with any questions, concerns or assistance.      Maternal Data Formula Feeding for Exclusion: No Has patient been taught Hand Expression?: Yes Does the patient have breastfeeding experience prior to this delivery?: No (P1)  Feeding Feeding Type: Breast Fed  LATCH Score Latch: Repeated attempts needed to sustain latch, nipple held in mouth throughout feeding, stimulation needed to elicit sucking reflex.  Audible Swallowing: A few with stimulation  Type of Nipple: Everted at rest and after stimulation  Comfort (Breast/Nipple): Filling, red/small blisters or bruises,  mild/mod discomfort  Hold (Positioning): Assistance needed to correctly position infant at breast and maintain latch.  LATCH Score: 6  Interventions Interventions: Breast feeding basics reviewed;Assisted with latch;Skin to skin;Breast massage;Hand express;Reverse pressure;Breast compression;Adjust position;Support pillows;Position options;Coconut oil  Lactation Tools Discussed/Used Tools: Coconut oil Breast pump type: Double-Electric Breast Pump WIC Program: Yes   Consult Status Consult Status: Follow-up Date: 01/06/20 Follow-up type: In-patient    Erica Atkins 01/05/2020, 8:00 PM

## 2020-01-06 ENCOUNTER — Ambulatory Visit: Payer: Self-pay

## 2020-01-06 NOTE — Lactation Note (Signed)
This note was copied from a baby's chart. Lactation Consultation Note  Patient Name: Erica Atkins ZHYQM'V Date: 01/06/2020 Reason for consult: Follow-up assessment;Mother's request;Difficult latch;Primapara;NICU baby;Preterm <34wks;Infant < 6lbs;Other (Comment) (Lick and learn session)  Assisted mom with positioning with pillow support with [name] in football hold on right breast for lick & learn session.  Mom continued to hand express drops of mature milk on [name]'s lips.  He would lick his tongue out and swallow.  He would not open his mouth wide enough to achieve latch so tried #20 nipple shield.  Mom filled the nipple shield up quickly with her milk.  We could get the nipple shield in his mouth, but he would push it out.  Once Apnar's SATs dropped into the 70's.  Removed him from the breast and they returned back to the 90's.  Did not try with the nipple shield again, but let him continue to lick out his tongue when mom expressed on his lips.  No further deSATs were noted.  Once tube feeding was started, he started falling asleep.  We removed him away from the breast, but left him skin to skin with mom.  Encouraged mom to call lactation with any questions, concerns or when needed assistance with lick and learn sessions. Maternal Data Formula Feeding for Exclusion: No Has patient been taught Hand Expression?: Yes Does the patient have breastfeeding experience prior to this delivery?: No (P1)  Feeding Feeding Type: Breast Milk  LATCH Score Latch: Repeated attempts needed to sustain latch, nipple held in mouth throughout feeding, stimulation needed to elicit sucking reflex.  Audible Swallowing: A few with stimulation  Type of Nipple: Everted at rest and after stimulation  Comfort (Breast/Nipple): Soft / non-tender  Hold (Positioning): Assistance needed to correctly position infant at breast and maintain latch.  LATCH Score: 7  Interventions    Lactation Tools  Discussed/Used Tools: Nipple Shields;49F feeding tube / Syringe (Tried nipple shield without much success) Nipple shield size: 20 Breast pump type: Double-Electric Breast Pump WIC Program: Yes   Consult Status Consult Status: Follow-up (Mom will continue to need help with lick & learn sessions) Follow-up type: Call as needed    Jarold Motto 01/06/2020, 9:08 PM

## 2020-01-09 ENCOUNTER — Encounter: Payer: Self-pay | Admitting: Advanced Practice Midwife

## 2020-01-09 DIAGNOSIS — Z30430 Encounter for insertion of intrauterine contraceptive device: Secondary | ICD-10-CM | POA: Insufficient documentation

## 2020-01-09 DIAGNOSIS — R87619 Unspecified abnormal cytological findings in specimens from cervix uteri: Secondary | ICD-10-CM | POA: Insufficient documentation

## 2020-01-12 ENCOUNTER — Ambulatory Visit: Payer: Self-pay

## 2020-01-12 NOTE — Lactation Note (Signed)
This note was copied from a baby's chart. Lactation Consultation Note  Patient Name: Erica Atkins Date: 01/12/2020   Mom still putting baby to the breast for lick and learn sessions skin to skin.  Mom still pumping sufficient volumes for baby.  Encouraged mom to call lactation with any questions, concerns or assistance.  Maternal Data    Feeding Feeding Type: Breast Milk  LATCH Score                   Interventions    Lactation Tools Discussed/Used     Consult Status      Jarold Motto 01/12/2020, 8:36 PM

## 2020-01-14 ENCOUNTER — Ambulatory Visit: Payer: Medicaid Other | Admitting: Family Medicine

## 2020-01-14 NOTE — Progress Notes (Unsigned)
Per Donnal Moat, CNM chart message in Epic on 01/14/2020, she agrees with plan of care by Dr. Verita Schneiders to repeat PAP 11/2022.  PAP was obtained at postpartum visit on 12/16/2019 and was Normal and HPV was Negative.  [facility] has taken over GYN care of patient at this time. Dahlia Bailiff, RN

## 2020-01-19 ENCOUNTER — Ambulatory Visit: Payer: Self-pay

## 2020-01-19 NOTE — Lactation Note (Signed)
This note was copied from a baby's chart. Lactation Consultation Note  Patient Name: Erica Atkins HOOIL'N Date: 01/19/2020   Mom put Erica Atkins to the breast for lick and learn session while he was tube fed. He was sleepy after his bath.  He slept at the breast.  Mom was happy that he was comfortable skin to skin and understands we need to take it slow and not stress him out.  Mom had lots of lactation questions which were addressed by LC.  Encouraged mom to call [facility] with any questions, concerns or assistance.  Maternal Data    Feeding Feeding Type: Breast Milk  LATCH Score                   Interventions    Lactation Tools Discussed/Used Tools: Pump;74F feeding tube / Syringe   Consult Status      Erica Atkins 01/19/2020, 6:51 PM

## 2020-01-20 ENCOUNTER — Ambulatory Visit: Payer: Self-pay

## 2020-01-20 NOTE — Lactation Note (Addendum)
This note was copied from a baby's chart. Lactation Consultation Note  Patient Name: Ernst Breach ELFYB'O Date: 01/20/2020   [name] had just been tube fed at 8 am so could not do lick and learn session.  Put him skin to skin with mom and got mom in comfortable position in chair laid back.  OT and lactation discussed progress with breast feeding to meet mom and [name]'s needs progressing slowly and not stressing him.  Mom will be leaving shortly and will not be returning until after 9 pm tonight.  Mom is not sure when she will be here to lick and learn, but definitely wants lactation assistance Thursday, October 28th when she will be here most of the day.  Mom had lots of lactation questions which were addressed.  Encouraged mom to call lactation with any questions, concerns or assistance.  Maternal Data    Feeding Feeding Type: Breast Milk  LATCH Score                   Interventions    Lactation Tools Discussed/Used Tools: 62F feeding tube / Syringe;Pump   Consult Status      Jarold Motto 01/20/2020, 2:11 PM

## 2020-01-23 ENCOUNTER — Ambulatory Visit (INDEPENDENT_AMBULATORY_CARE_PROVIDER_SITE_OTHER): Payer: Medicaid Other | Admitting: Family Medicine

## 2020-01-23 ENCOUNTER — Other Ambulatory Visit: Payer: Self-pay

## 2020-01-23 ENCOUNTER — Encounter: Payer: Self-pay | Admitting: Family Medicine

## 2020-01-23 ENCOUNTER — Ambulatory Visit: Payer: Self-pay

## 2020-01-23 VITALS — BP 120/83 | HR 81

## 2020-01-23 DIAGNOSIS — Z30431 Encounter for routine checking of intrauterine contraceptive device: Secondary | ICD-10-CM | POA: Diagnosis not present

## 2020-01-23 DIAGNOSIS — Z23 Encounter for immunization: Secondary | ICD-10-CM

## 2020-01-23 NOTE — Progress Notes (Signed)
   Subjective:    Patient ID: Erica Atkins is a 39 y.o. female presenting with string check  on 01/23/2020  HPI: Here for IUD string check. Had Mirena placed on 12/16/2019. No issues. Cramping and bleeding stopped.  Review of Systems  Constitutional: Negative for chills and fever.  Respiratory: Negative for shortness of breath.   Cardiovascular: Negative for chest pain.  Gastrointestinal: Negative for abdominal pain, nausea and vomiting.  Genitourinary: Negative for dysuria.  Skin: Negative for rash.      Objective:    BP 120/83   Pulse 81  Physical Exam Constitutional:      General: She is not in acute distress.    Appearance: She is well-developed.  HENT:     Head: Normocephalic and atraumatic.  Eyes:     General: No scleral icterus. Cardiovascular:     Rate and Rhythm: Normal rate.  Pulmonary:     Effort: Pulmonary effort is normal.  Abdominal:     Palpations: Abdomen is soft.  Genitourinary:    Comments: IUD strings are visualized. Musculoskeletal:     Cervical back: Neck supple.  Skin:    General: Skin is warm and dry.  Neurological:     Mental Status: She is alert and oriented to person, place, and time.         Assessment & Plan:  IUD check up - In place, no issues.  Need for influenza vaccination   Total time in review of prior notes, pathology, labs, history taking, review with patient, exam, note writing, discussion of options, plan for next steps, alternatives and risks of treatment: 10 minutes.  Return if symptoms worsen or fail to improve.  Donnamae Jude 01/23/2020 10:34 AM

## 2020-01-23 NOTE — Lactation Note (Signed)
This note was copied from a baby's chart. Lactation Consultation Note  Patient Name: Erica Atkins ZHYQM'V Date: 01/23/2020 Reason for consult: Follow-up assessment;Preterm <34wks;Infant < 6lbs;Other (Comment) (lick and learn)  RN consulted with LC, along with SP/PT re: behaviors with baby, calming techniques, and keeping baby swaddled in transfer from crib to mom. Lactation at bedside to assist with lick and learn during 11am touch/feeding time. [place] assisted mom with comfortable position and support pillows. Discussed with mom an opportunity to provide [name] with additional support and containment with use of Halo swaddle to ease his transfer from crib to bed, mom open to information and idea. [name] placed in football hold on mom's left breast; LC reviewing positioning, alignment, and baby's calmness. [name] licked a few time expressed milk from nipple, but was not eager to latch or attempt feed. RN began tube feeding.  LC remained beside mom, assisted with transitioning baby more skin to skin by slowing removing the Halo, leaving him in football hold next to breast while he received his feeding. LC provided education on the benefits that this can bring for helping [name] in his breastfeeding journey, importance of keeping him calm, and offering but not forcing the breast. Baby tolerated skin to skin well.  Maternal Data Formula Feeding for Exclusion: No Has patient been taught Hand Expression?: Yes Does the patient have breastfeeding experience prior to this delivery?: No  Feeding Feeding Type: Breast Milk  LATCH Score Latch: Too sleepy or reluctant, no latch achieved, no sucking elicited.  Audible Swallowing: None  Type of Nipple: Everted at rest and after stimulation  Comfort (Breast/Nipple): Soft / non-tender  Hold (Positioning): Assistance needed to correctly position infant at breast and maintain latch.  LATCH Score: 5  Interventions Interventions: Breast feeding basics  reviewed;Assisted with latch;Skin to skin;Hand express;Adjust position;Support pillows;Position options  Lactation Tools Discussed/Used Tools: 73F feeding tube / Syringe;Pump   Consult Status Consult Status: PRN Date: 01/23/20 Follow-up type: Call as needed    Lavonia Drafts 01/23/2020, 12:28 PM

## 2020-01-26 ENCOUNTER — Ambulatory Visit: Payer: Self-pay

## 2020-01-26 NOTE — Lactation Note (Signed)
This note was copied from a baby's chart. Lactation Consultation Note  Patient Name: Erica Atkins Date: 01/26/2020   Mom is bringing in plenty of breast milk for [name] and is also pumping with Symphony pump which we are keeping at his bedside when she is visiting.  Mom's nipples are slightly tender today.  We have changed her flanges to #30 for now to see if that is more comfortable.  We are still putting him skin to skin with mom which he is tolerating well.  Lick and learn sessions are on hold for now.  Everytime we would try to get him to go to the breast with or without the nipple shield, he would push away fussing.  Explained this could partly be due to his Klinefelter Syndrome condition.  To prevent any further stress for now, we will continue with just doing skin to skin and reassess in the future when he seems more receptive.  Encouraged mom to call lactation with any questions, concerns or assistance.    Maternal Data    Feeding Feeding Type: Breast Milk  LATCH Score                   Interventions    Lactation Tools Discussed/Used Tools: 66F feeding tube / Syringe   Consult Status      Jarold Motto 01/26/2020, 7:23 PM

## 2020-02-02 ENCOUNTER — Ambulatory Visit: Payer: Self-pay

## 2020-02-02 NOTE — Lactation Note (Signed)
This note was copied from a baby's chart. Lactation Consultation Note  Patient Name: Erica Atkins Date: 02/02/2020   Mom is working on Geneticist, molecular, but still wants to put him to the breast when possible.  Encouraged mom to call with assistance needed.  Maternal Data    Feeding Feeding Type: Breast Milk Nipple Type: Nfant Extra Slow Flow (gold)  LATCH Score                   Interventions    Lactation Tools Discussed/Used     Consult Status      Jarold Motto 02/02/2020, 7:09 PM

## 2020-02-06 ENCOUNTER — Other Ambulatory Visit: Payer: Self-pay | Admitting: Obstetrics and Gynecology

## 2020-04-03 ENCOUNTER — Other Ambulatory Visit: Payer: Medicaid Other

## 2020-04-03 DIAGNOSIS — Z20822 Contact with and (suspected) exposure to covid-19: Secondary | ICD-10-CM

## 2020-04-07 LAB — NOVEL CORONAVIRUS, NAA: SARS-CoV-2, NAA: DETECTED — AB

## 2020-05-31 IMAGING — US US MFM OB COMPLETE +14 WKS
2 series · 14 of 28 positions shown · non-contrast
Comparison: none

[Series 1: us mfm ob complete +14 wks · 2 of 13 slices shown (1 of 2)]
[im 5/13]
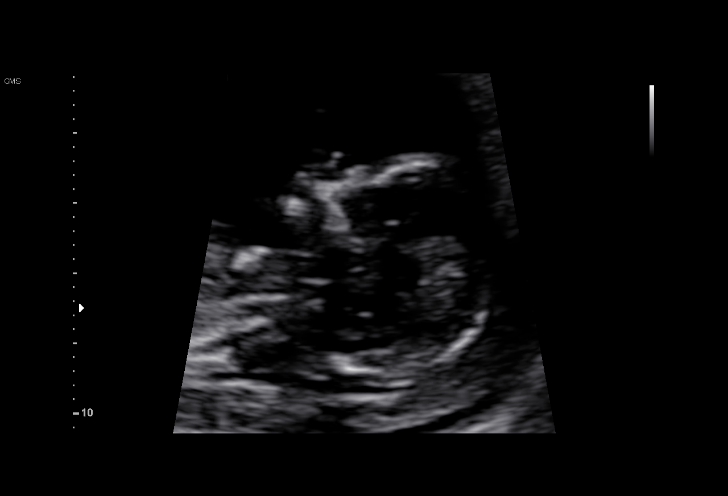
[im 13/13]
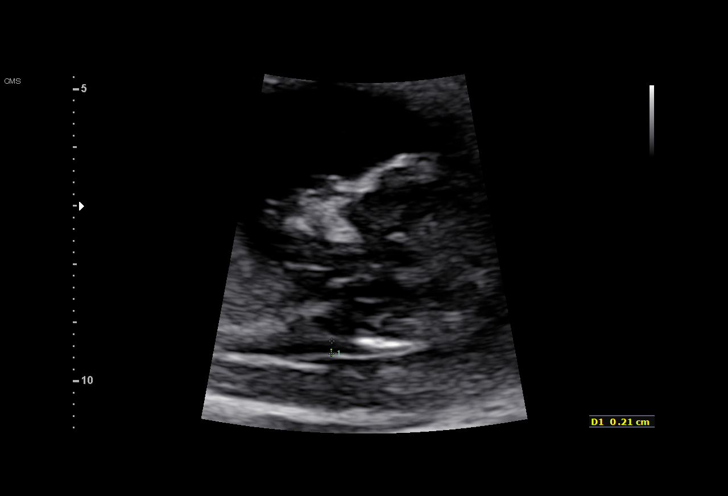

[Series 1: us mfm ob complete +14 wks · 12 of 71 slices shown (2 of 2)]
[im 4/71]
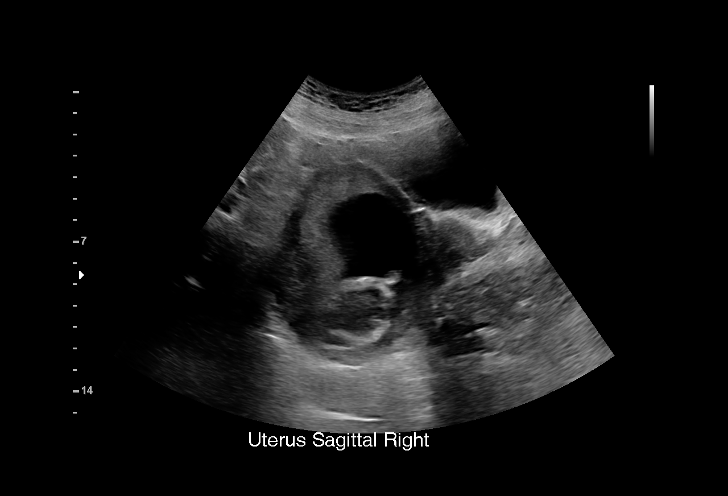
[im 10/71]
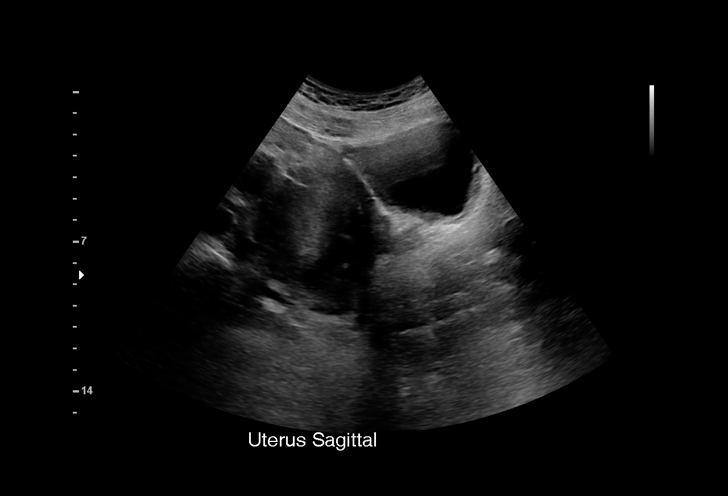
[im 16/71]
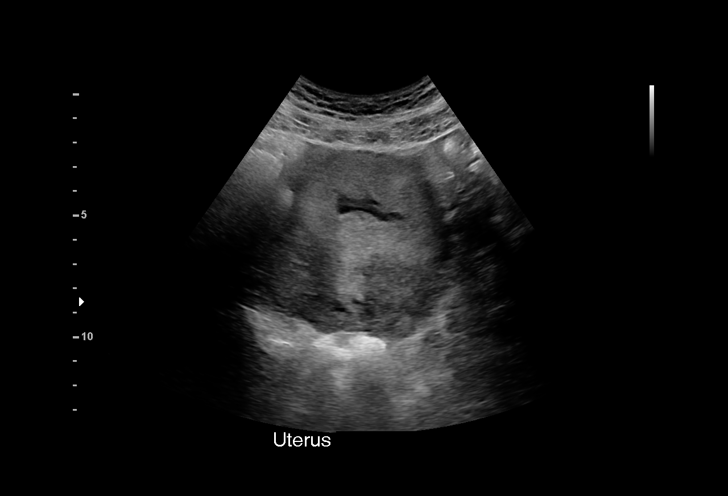
[im 22/71]
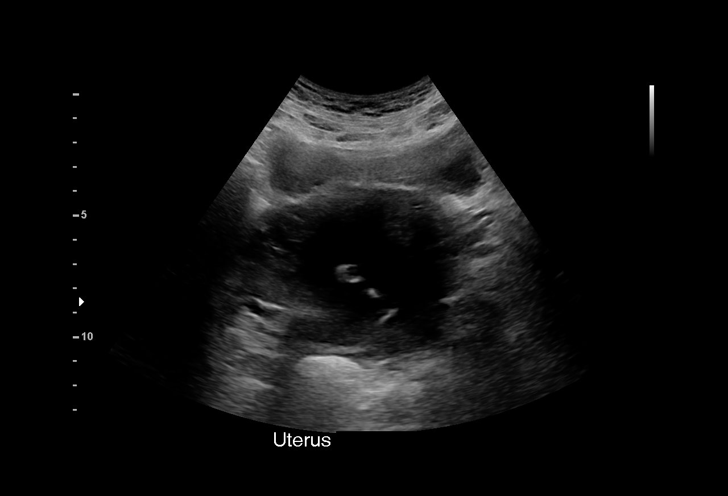
[im 28/71]
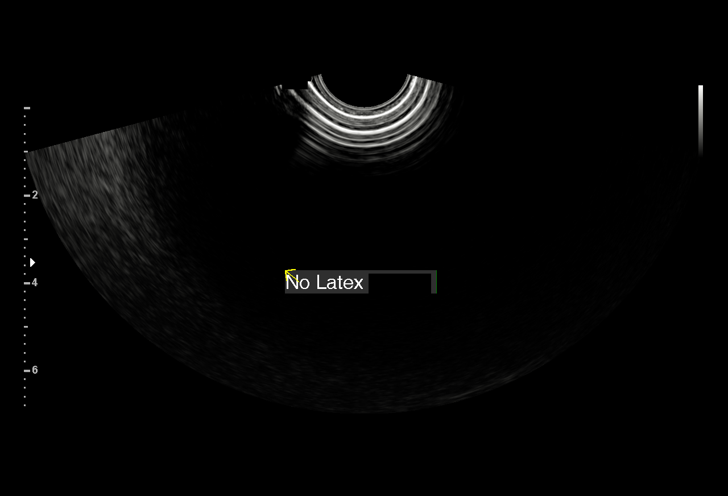
[im 34/71]
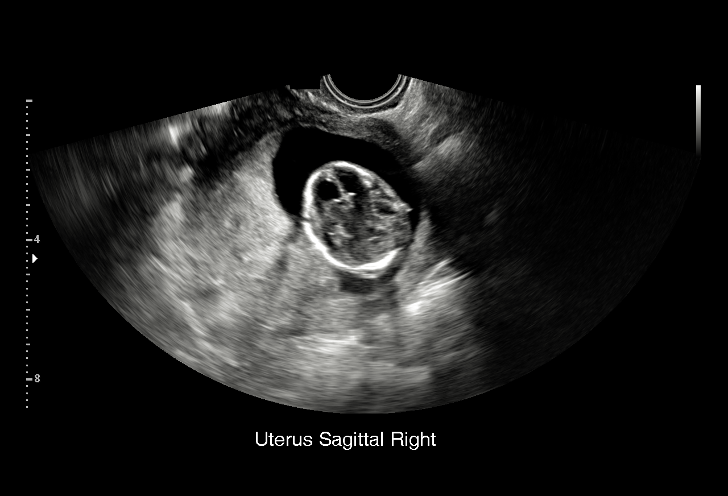
[im 40/71]
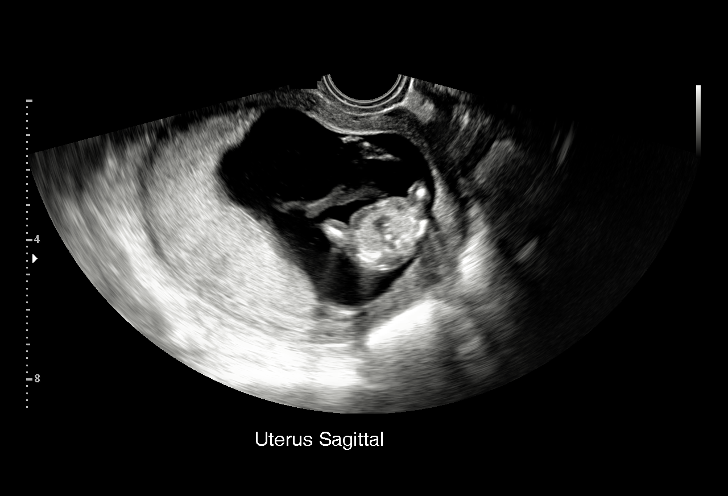
[im 46/71]
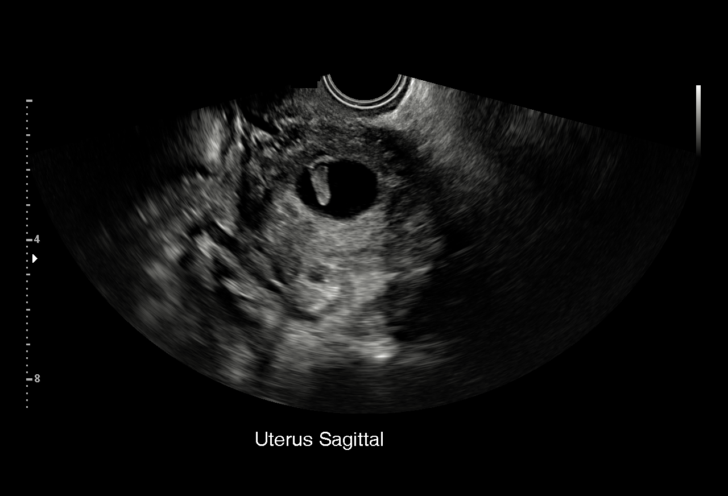
[im 52/71]
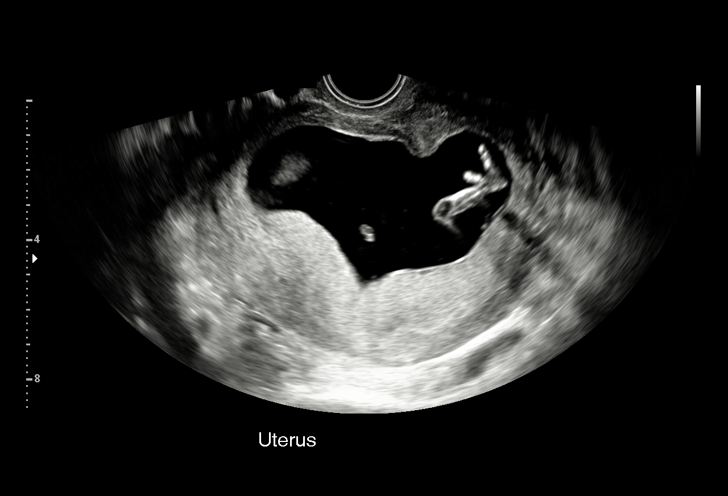
[im 58/71]
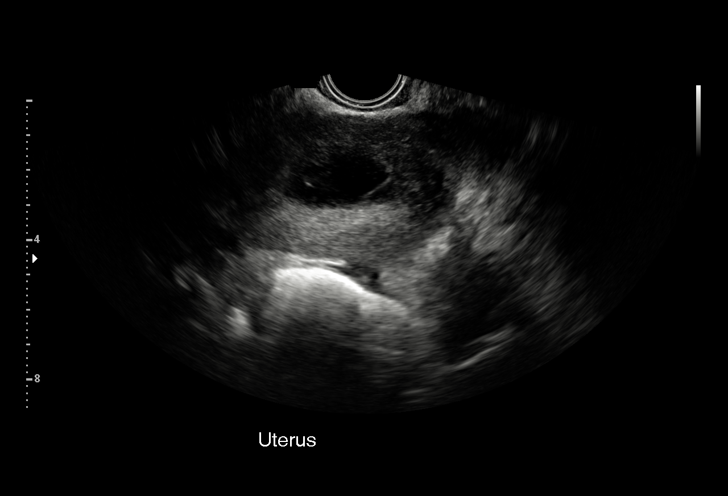
[im 64/71]
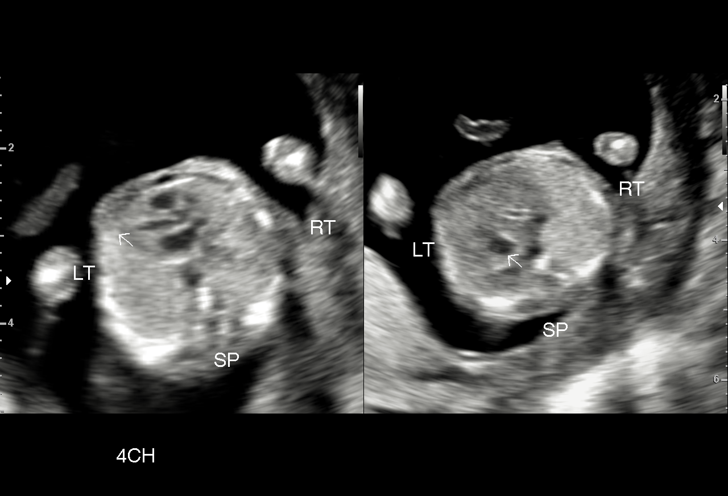
[im 71/71]
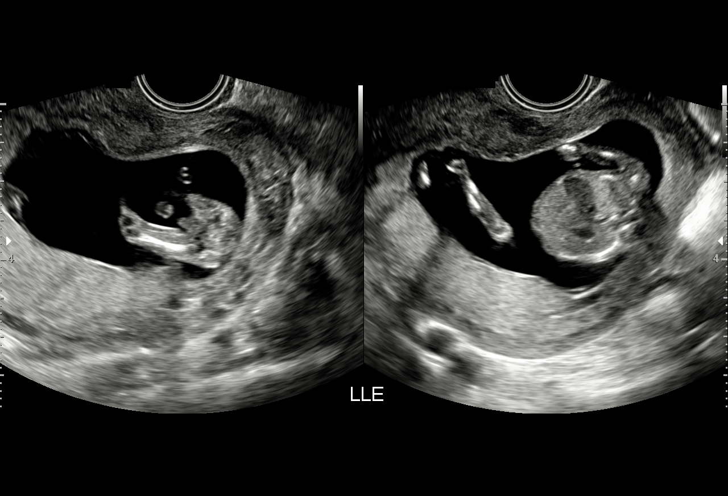

[14 of 28 positions shown; findings below may reference images not displayed]

WEEKS

Indications

 Advanced maternal age multigravida 35+, first
 trimester
 Encounter for antenatal screening for other
 genetic defects
 Abnormal biochemical screen (XXY NIPS)
 13 weeks gestation of pregnancy
Fetal Evaluation

 Num Of Fetuses:          1
 Fetal Heart Rate(bpm):   147
 Cardiac Activity:        Observed
 Presentation:            Transverse, head to maternal right
 Placenta:                Posterior
 P. Cord Insertion:       Visualized
Biometry

 CRL:      78.5   mm     G. Age:  13w 4d                   EDD:   02/08/20

 NT:         2.4  mm
OB History

 Gravidity:     3         Term:  0
 Living:        0
Gestational Age

 LMP:            13w 2d       Date:  05/06/19                   EDD:  02/10/20
 Best:           13w 2d    Det. By:  LMP  (05/06/19)            EDD:  02/10/20
Anatomy

 Choroid Plexus:         Viusualized            Bladder:                Visualized
 Stomach:                Visualized             Upper Extremities:      Visualized
 Abdominal Wall:         Visualized             Lower Extremities:      Visualized
Cervix Uterus Adnexa

 Cervix
 Length:            3.93  cm.
Impression

 Ms. Evin is here for first trimester anatomy. She had a recent
 panorama result demonstrating abnormal sex chromosomes of
 XXY (Klienfelters syndrome). She is here for ultrasound and
 genetic counseling.
 Her prior history consist of a missed abortion with noted cystic
 hygroma. She underwent a D&C with normal microarray.
 Today's ultrasound revealed normal anatomy with NT of 2.4 mm

 She had genetic counseling to discuss the options for testing
 and ovarall sequelae of XXY sex chromosomes.

 I personally reviewed that there are no particular findings
 prenatally characteristic of Klinefelters syndrome ,as the core
 features are revealed in adolecense that includes a wide array
 of IQ, hypogonadism, long limbs, social and Latrice
 difficulties. I reviewed the option of invasive testing and
 postnatal testing. She declined further testing at this time as
 testing would not change her care for the pregnancy . Please
 see genetic counseling note for further details.
Recommendations

 Please perform detailed anatomy between 18-20 weeks, this
 has not been scheduled in our clinic today as Ms. Evin
 assumed she would continue care at your offices.

## 2020-08-30 IMAGING — US US MFM OB DETAIL+14 WK
1 series · 16 of 28 positions shown · non-contrast
Comparison: none

[Series 1: us mfm ob detail+14 wk · 98 acquisitions, 16 frames shown]
[im 1/98]
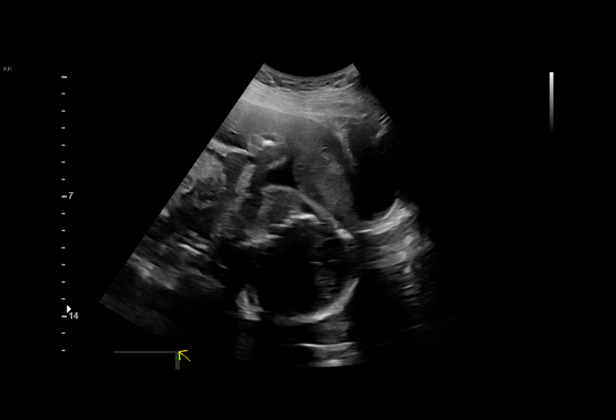
[im 8/98]
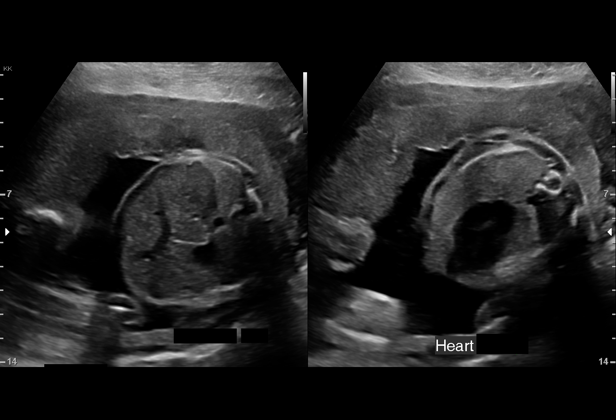
[im 15/98]
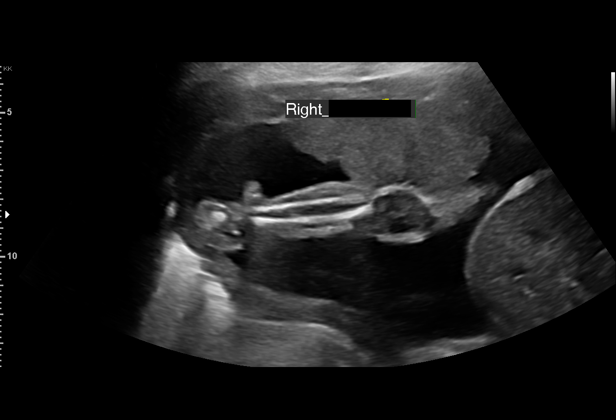
[im 22/98]
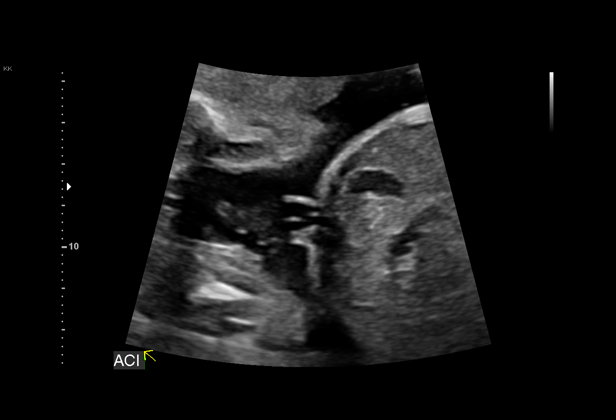
[im 26/98]
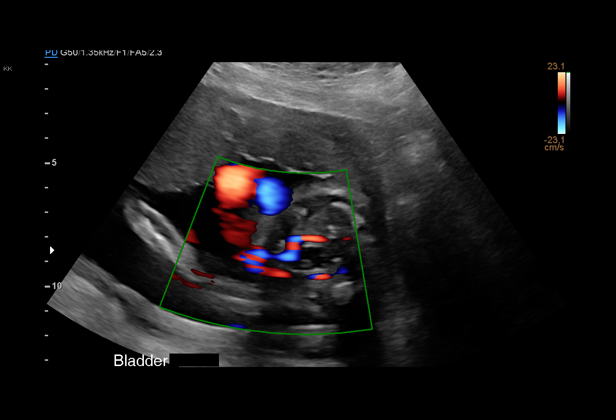
[im 33/98]
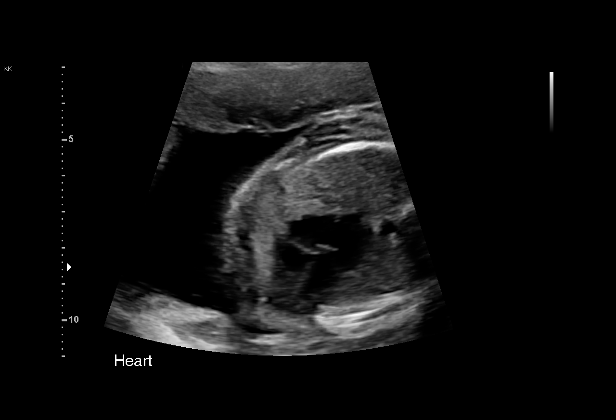
[im 40/98]
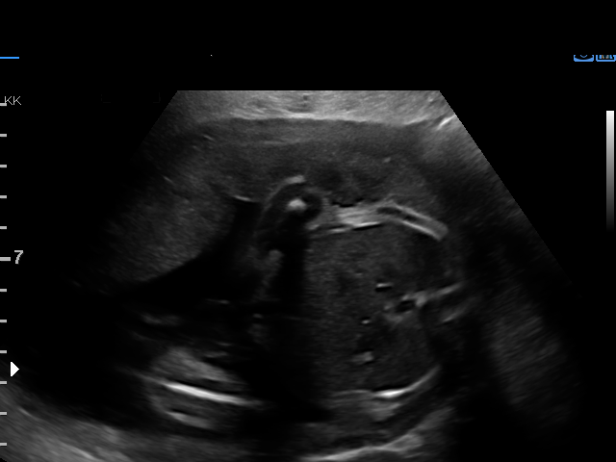
[im 47/98]
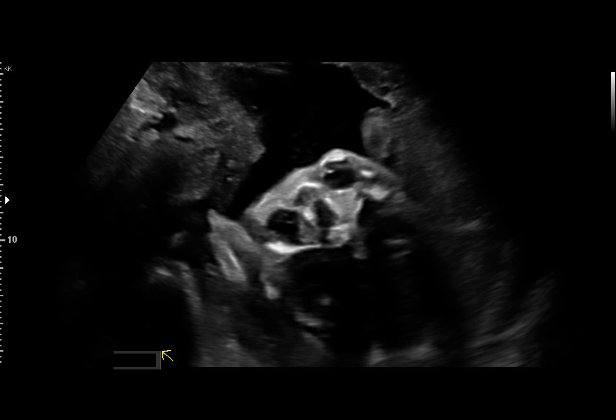
[im 51/98]
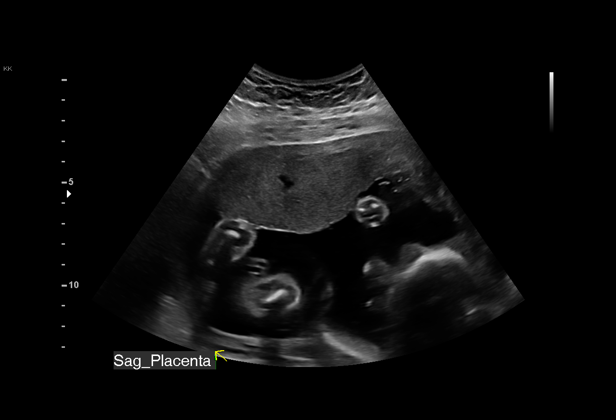
[im 58/98]
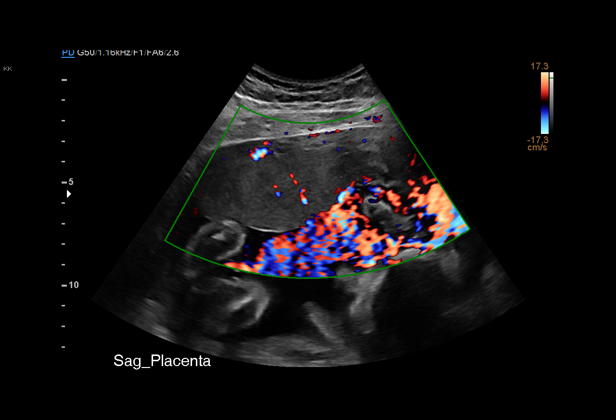
[im 65/98]
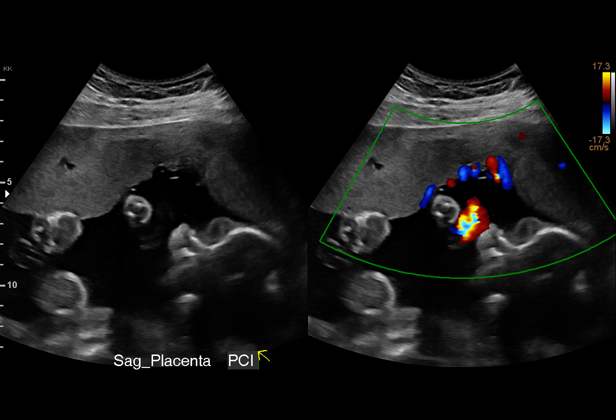
[im 72/98]
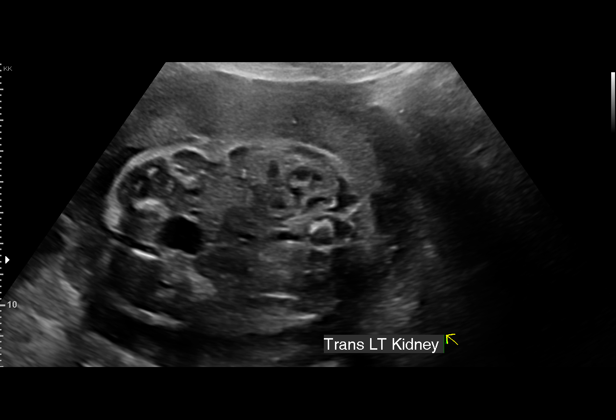
[im 76/98]
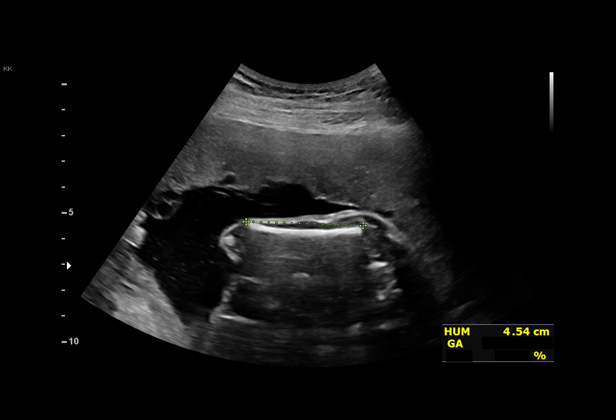
[im 83/98]
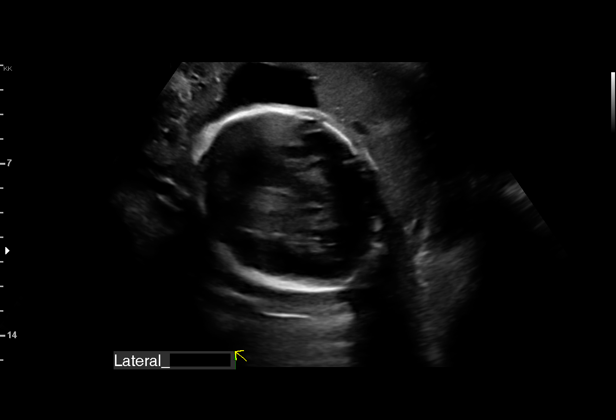
[im 90/98]
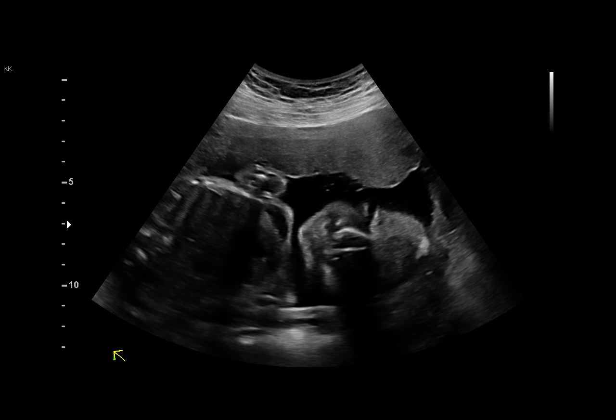
[im 98/98]
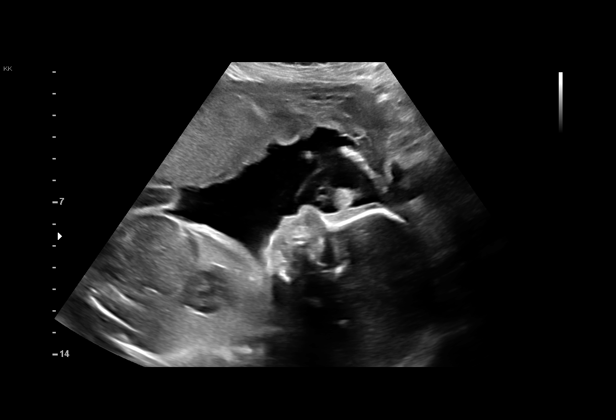

[16 of 28 positions shown; findings below may reference images not displayed]

Indications

 Hypertension - Chronic with superimposed
 preeclampsia (Labetalol)
 Abnormal biochemical screen (XXY
 NIPS)(Klienfelters Syndrome)
 Poor obstetrical history (Prior Pregnancy
 with Cystic Hygroma 7676)
 26 weeks gestation of pregnancy
 Encounter for antenatal screening for
 malformations
 Advanced maternal age multigravida 35+,
 second trimester (39 yrs)
 Rh negative state in antepartum
Vital Signs

 BMI:
Fetal Evaluation

 Num Of Fetuses:         1
 Fetal Heart Rate(bpm):  125
 Cardiac Activity:       Observed
 Presentation:           Cephalic
 Placenta:               Anterior
 P. Cord Insertion:      Visualized

 Amniotic Fluid
 AFI FV:      Within normal limits

                             Largest Pocket(cm)

Biometry

 BPD:      67.8  mm     G. Age:  27w 2d         74  %    CI:        78.01   %    70 - 86
                                                         FL/HC:      20.4   %    18.6 -
 HC:      242.9  mm     G. Age:  26w 3d         27  %    HC/AC:      1.07        1.04 -
 AC:      227.1  mm     G. Age:  27w 1d         67  %    FL/BPD:     73.2   %    71 - 87
 FL:       49.6  mm     G. Age:  26w 5d         49  %    FL/AC:      21.8   %    20 - 24
 HUM:      45.4  mm     G. Age:  26w 6d         59  %

 Est. FW:    2333  gm      2 lb 3 oz     65  %
OB History

 Gravidity:    3         Term:   0        Prem:   0        SAB:   0
 TOP:          2       Ectopic:  0        Living: 0
Gestational Age

 LMP:           26w 2d        Date:  05/06/19                 EDD:   02/10/20
 U/S Today:     26w 6d                                        EDD:   02/06/20
 Best:          26w 2d     Det. By:  LMP  (05/06/19)          EDD:   02/10/20
Anatomy

 Cranium:               Appears normal         Aortic Arch:            Not well visualized
 Cavum:                 Not well visualized    Ductal Arch:            Not well visualized
 Ventricles:            Appears normal         Diaphragm:              Appears normal
 Choroid Plexus:        Appears normal         Stomach:                Appears normal, left
                                                                       sided
 Cerebellum:            Not well visualized    Abdomen:                Appears normal
 Posterior Fossa:       Not well visualized    Abdominal Wall:         Appears nml (cord
                                                                       insert, abd wall)
 Nuchal Fold:           Not well visualized    Cord Vessels:           Appears normal (3
                                                                       vessel cord)
 Face:                  Orbits appear          Kidneys:                Appear normal
                        normal
 Lips:                  Appears normal         Bladder:                Appears normal
 Thoracic:              Appears normal         Spine:                  Not well visualized
 Heart:                 Not well visualized    Upper Extremities:      Appears normal
 RVOT:                  Not well visualized    Lower Extremities:      Appears normal
 LVOT:                  Not well visualized

 Other:  Left Heel visualized. Technically difficult due to fetal position.
Cervix Uterus Adnexa

 Cervix
 Not visualized (advanced GA >91wks)
Comments

 Thamar Kajo was seen for a follow-up consultation due to
 severe range blood pressures and a persistent headache.
 The patient was seen at [[facility] a few
 days ago by Dr. Stephane.  She was transferred from
 [HOSPITAL] to [facility]OS[facility]] last night as she had a
 severe headache along with severe range blood pressures
 that required treatment with IV labetalol.  She was transferred
 due to her early gestational age so that her baby could get
 the appropriate level of NICU care should she require
 delivery.  The patient had been placed on magnesium sulfate
 for maternal seizure prophylaxis and received the first dose of
 Celestone prior to being transferred.

 Since being admitted to [HOSPITAL], the patient has been
 treated with labetalol 400 mg 3 times a day and she has been
 continued on magnesium sulfate for maternal seizure
 prophylaxis.  The patient continues to complain of a
 headache primarily pain behind her eyes.  Her blood
 pressures overnight were in the 130s to 140s over 80s to
 90s.  Her PIH labs on admission were within normal limits.
 She currently has a 24-hour urine being collected.  Her P/C
 ratio at [HOSPITAL] did not indicate significant proteinuria.  Her
 deep tendon reflexes are 3+ bilaterally.  There were no signs
 of clonus noted today.

 On an ultrasound exam performed today, the overall EFW
 measures appropriate for her gestational age.  There was
 normal amniotic fluid noted.  As this was a bedside
 ultrasound, the views of the fetal anatomy were limited.  Her
 NST has been reassuring for her gestational age.

 The patient was advised that her continued headache along
 with her brisk reflexes are concerning signs that may indicate
 that she may be developing severe preeclampsia.

 At her current gestational age (26 weeks 2 days), we will try
 to do everything possible to delay delivery so that she can
 reach a more optimal gestational age.

 She should continue magnesium sulfate for maternal seizure
 prophylaxis until tomorrow and should receive the second
 dose of Celestone later this evening.

 Her blood pressures should be controlled using labetalol as
 prescribed.  Should her blood pressures continue to increase,
 the dosage of labetalol may be increased and her Procardia
 may be restarted.

 Due to her continued headaches and elevated blood
 pressures, I would recommend that she remain in the hospital
 for close observation.  Should her 24-hour urine indicate
 significant proteinuria (greater than 300 mg), I would
 recommend that she remain in the hospital until delivery.

 She should have PIH labs evaluated frequently (daily should
 her blood pressures remain elevated or 2-3 times a week
 should her blood pressures be less than 140/90).

 Should she require delivery before 32 weeks, magnesium
 sulfate would be indicated for fetal neuroprotection prior to
 delivery.

 She should receive a rescue course of antenatal
 corticosteroids should she require delivery at 34 weeks or
 less and it has been 7 or more days since she received the
 initial course.

 She should have weekly biophysical profiles and amniotic
 fluid checks.  Another growth ultrasound should be scheduled
 in 3 weeks should she remain undelivered.

 The patient understands that delivery is the only treatment for
 preeclampsia.  Hopefully, we will be able to delay delivery
 until she reaches a more optimal gestational age (30-32
 weeks or greater).

 The indications for immediate delivery would be:

 -If her PIH labs show any abnormalities.
 -Should her blood pressures be persistently greater than
 160s over 100s despite medication treatment.
 -At any time for nonreassuring fetal status.
 -Should her headaches worsen or should she develop any
 other signs and symptoms of severe preeclampsia.

## 2020-09-04 IMAGING — US US MFM OB LIMITED
1 series · 15 of 28 positions shown · non-contrast
Comparison: none

[Series 1: us mfm ob limited · 15 of 28 slices shown]
[im 1/28]
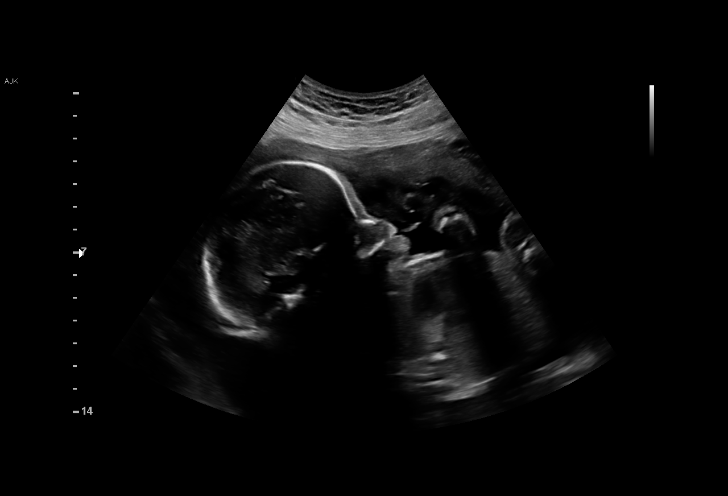
[im 3/28]
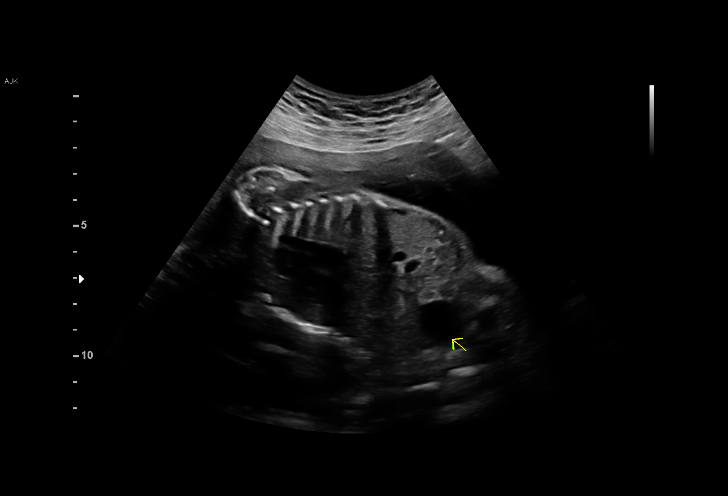
[im 5/28]
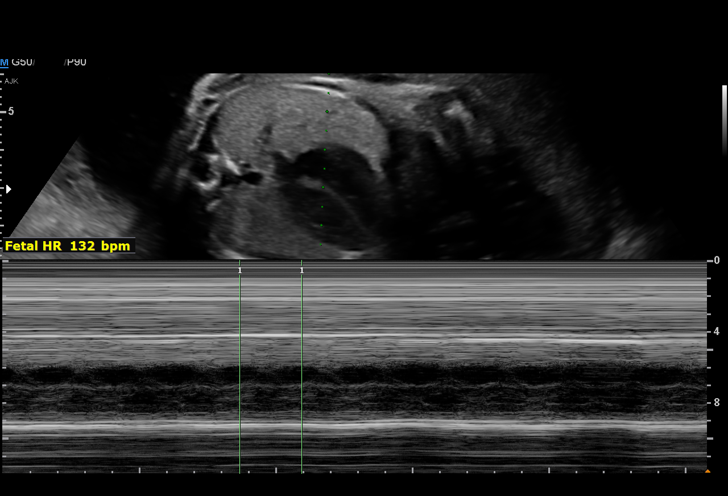
[im 7/28]
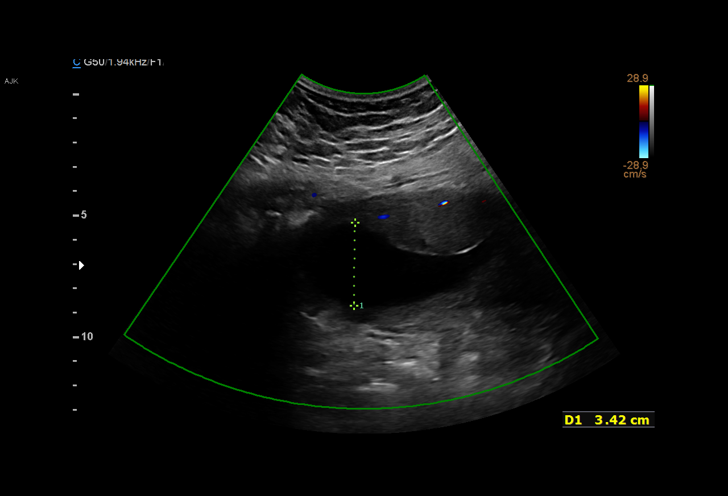
[im 9/28]
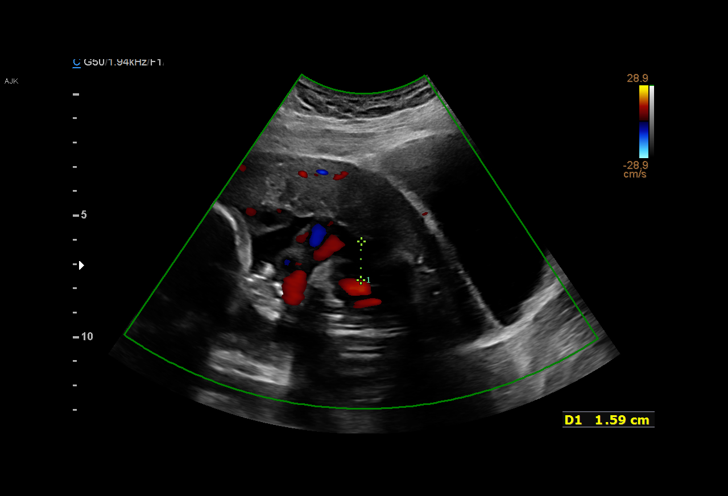
[im 11/28]
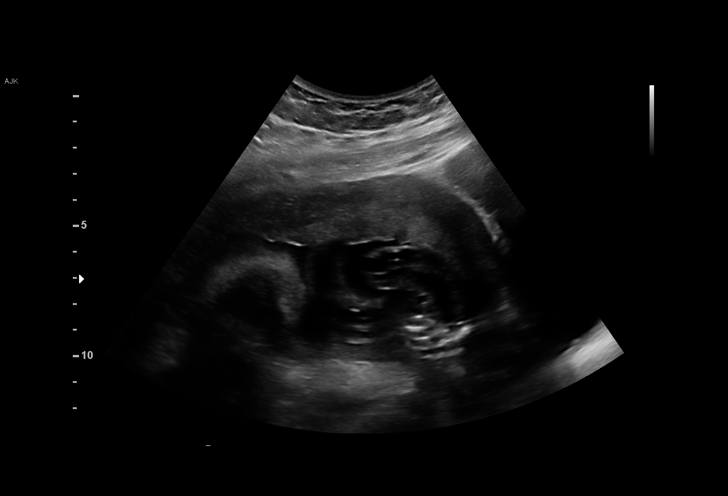
[im 13/28]
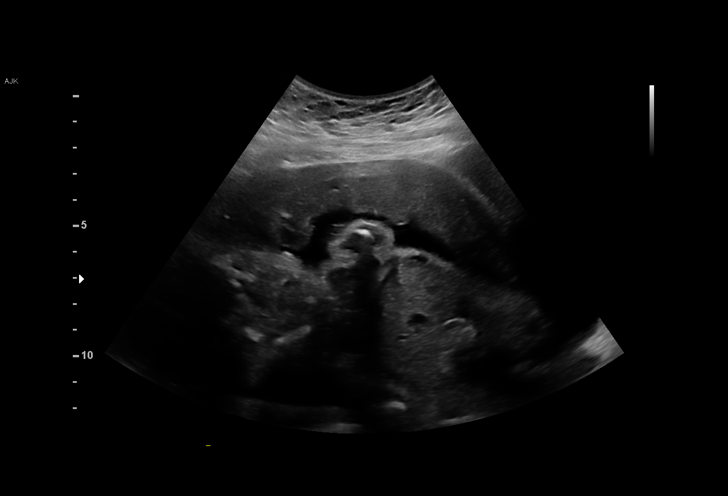
[im 15/28]
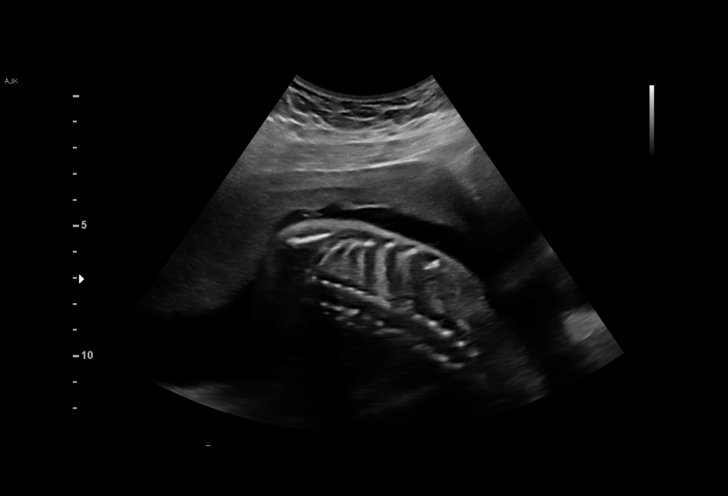
[im 16/28]
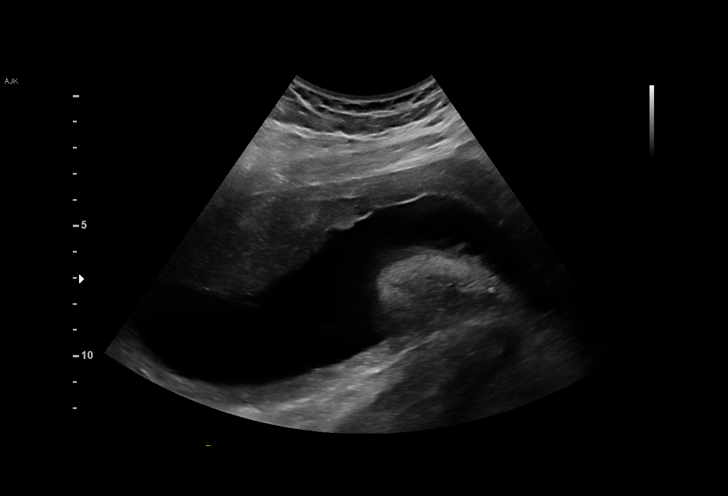
[im 18/28]
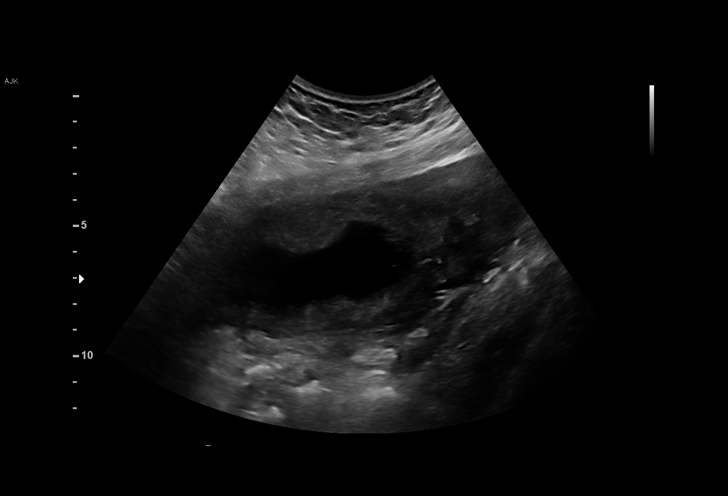
[im 20/28]
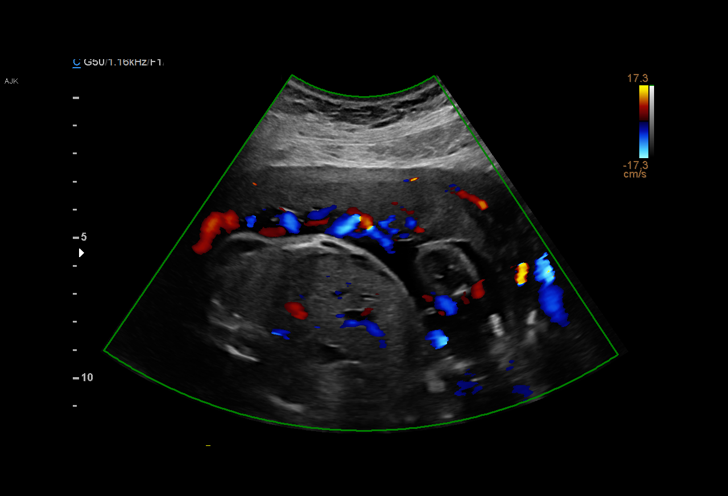
[im 22/28]
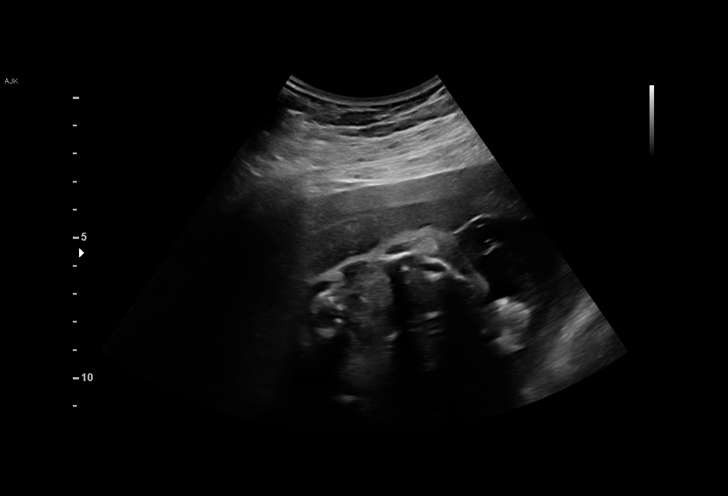
[im 24/28]
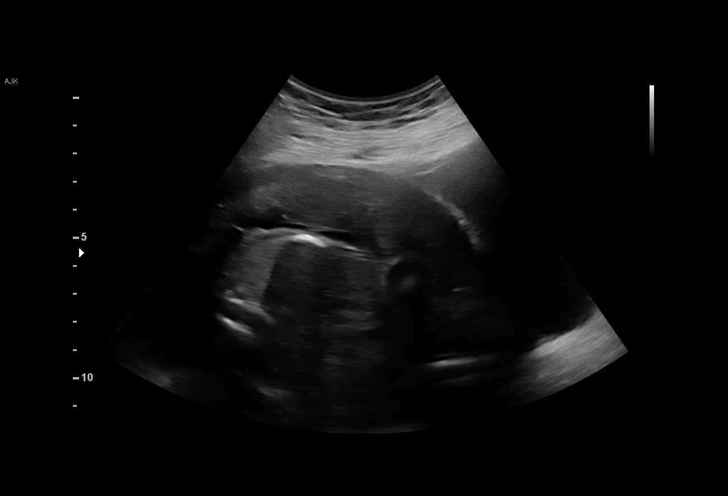
[im 26/28]
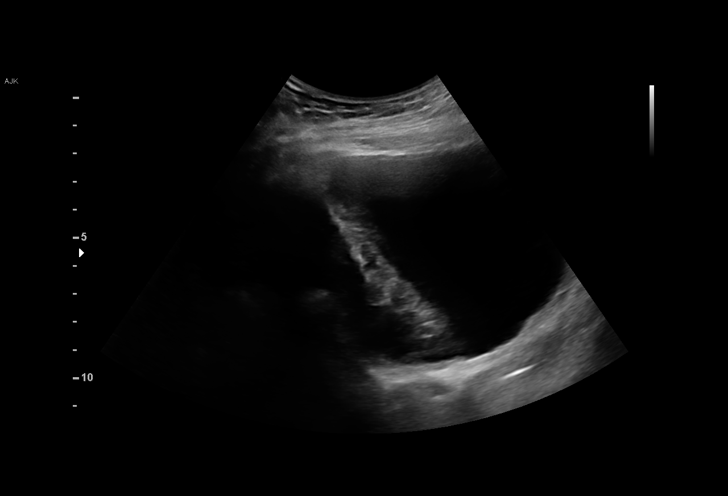
[im 28/28]
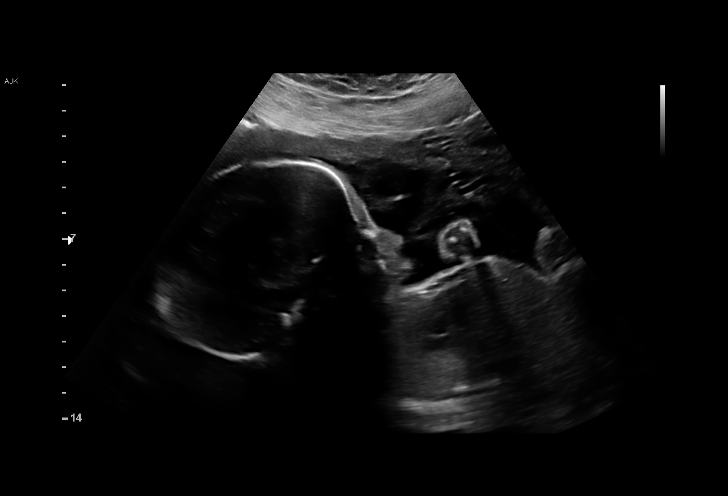

[15 of 28 positions shown; findings below may reference images not displayed]

Indications

 27 weeks gestation of pregnancy
 Hypertension - Chronic with superimposed
 preeclampsia (Labetalol)
 Abnormal biochemical screen (XXY
 NIPS)(Klienfelters Syndrome)
 Poor obstetrical history (Prior Pregnancy
 with Cystic Hygroma 8282)
 Encounter for antenatal screening for
 malformations
 Advanced maternal age multigravida 35+,
 second trimester (39 yrs)
Vital Signs

                                                Height:        5'5"
Fetal Evaluation

 Num Of Fetuses:         1
 Fetal Heart Rate(bpm):  132
 Cardiac Activity:       Observed
 Presentation:           Breech
 Placenta:               Anterior
 P. Cord Insertion:      Visualized

 Amniotic Fluid
 AFI FV:      Within normal limits

                             Largest Pocket(cm)

OB History

 Gravidity:    3         Term:   0        Prem:   0        SAB:   0
 TOP:          2       Ectopic:  0        Living: 0
Gestational Age

 LMP:           27w 0d        Date:  05/06/19                 EDD:   02/10/20
 Best:          27w 0d     Det. By:  LMP  (05/06/19)          EDD:   02/10/20
Impression

 Patient was transferred from [[facility]] on 11/06/2019 with severe range and gestational
 hypertension.  She is on labetalol 600 mg 3 times daily and
 Procardia XL 60 mg twice daily.  Patient required IV
 hydralazine yesterday to control severe range blood
 pressures.  Her blood pressures range (over 12 hours) 134-
 167/73-97 mmHg.
 Labs: Creatinine 0.43, AST 34, ALT 54, platelets 172.

 On ultrasound, amniotic fluid is normal and good fetal activity
 seen.  Breech presentation.
 NST is reactive (moderate variability with occasional variable
 decelerations) for this gestational age.
Recommendations

 -Continue expectant management.
 -If patient requires IV antihypertensive or increase in dosage
 of antihypertensive or exhibit symptoms of impending
 eclampsia, consider delivery.
                 Mccollum, Mayuri

## 2020-12-17 ENCOUNTER — Ambulatory Visit (INDEPENDENT_AMBULATORY_CARE_PROVIDER_SITE_OTHER): Payer: Medicaid Other | Admitting: Obstetrics and Gynecology

## 2020-12-17 ENCOUNTER — Encounter: Payer: Self-pay | Admitting: Obstetrics and Gynecology

## 2020-12-17 ENCOUNTER — Other Ambulatory Visit: Payer: Self-pay

## 2020-12-17 VITALS — BP 126/84 | Ht 64.0 in | Wt 168.0 lb

## 2020-12-17 DIAGNOSIS — Z1331 Encounter for screening for depression: Secondary | ICD-10-CM

## 2020-12-17 DIAGNOSIS — N760 Acute vaginitis: Secondary | ICD-10-CM

## 2020-12-17 DIAGNOSIS — Z1339 Encounter for screening examination for other mental health and behavioral disorders: Secondary | ICD-10-CM | POA: Diagnosis not present

## 2020-12-17 DIAGNOSIS — Z01419 Encounter for gynecological examination (general) (routine) without abnormal findings: Secondary | ICD-10-CM | POA: Diagnosis not present
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
## 2020-12-17 NOTE — Patient Instructions (Signed)
[facility] in [place], [place] Address: [facility], [address] [place], [place] [address] Phone: [phone]

## 2020-12-20 LAB — NUSWAB VAGINITIS PLUS (VG+)
Candida albicans, NAA: NEGATIVE
Candida glabrata, NAA: NEGATIVE
Chlamydia trachomatis, NAA: NEGATIVE
Neisseria gonorrhoeae, NAA: NEGATIVE
Trich vag by NAA: NEGATIVE

## 2021-10-27 ENCOUNTER — Ambulatory Visit: Payer: Medicaid Other | Admitting: Physical Therapy
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
## 2023-03-20 ENCOUNTER — Other Ambulatory Visit: Payer: Self-pay | Admitting: Family Medicine

## 2023-03-20 ENCOUNTER — Encounter: Payer: Self-pay | Admitting: Family Medicine

## 2023-03-20 DIAGNOSIS — N6452 Nipple discharge: Secondary | ICD-10-CM

## 2023-03-20 DIAGNOSIS — N644 Mastodynia: Secondary | ICD-10-CM

## 2023-04-26 ENCOUNTER — Ambulatory Visit
Admission: RE | Admit: 2023-04-26 | Discharge: 2023-04-26 | Disposition: A | Discharge status: Home / Self Care | Payer: Medicaid Other | Source: Ambulatory Visit | Attending: Family Medicine | Admitting: Family Medicine

## 2023-04-26 ENCOUNTER — Ambulatory Visit: Payer: Medicaid Other

## 2023-04-26 DIAGNOSIS — N6452 Nipple discharge: Secondary | ICD-10-CM

## 2023-04-26 DIAGNOSIS — N644 Mastodynia: Secondary | ICD-10-CM

## 2024-04-04 ENCOUNTER — Other Ambulatory Visit (HOSPITAL_COMMUNITY): Payer: Self-pay

## 2024-04-04 DIAGNOSIS — R200 Anesthesia of skin: Secondary | ICD-10-CM

## 2024-04-04 DIAGNOSIS — M5416 Radiculopathy, lumbar region: Secondary | ICD-10-CM

## 2024-04-04 DIAGNOSIS — R29898 Other symptoms and signs involving the musculoskeletal system: Secondary | ICD-10-CM

## 2024-04-04 DIAGNOSIS — M5459 Other low back pain: Secondary | ICD-10-CM

## 2024-04-04 DIAGNOSIS — M51361 Other intervertebral disc degeneration, lumbar region with lower extremity pain only: Secondary | ICD-10-CM
